# Patient Record
Sex: Female | Born: 1980 | Race: White | Hispanic: No | Marital: Married | Smoking: Never smoker
Health system: Southern US, Community
[De-identification: ages and names within clinical notes are randomized; demographics above are authoritative.]

## PROBLEM LIST (undated history)

## (undated) DIAGNOSIS — O24419 Gestational diabetes mellitus in pregnancy, unspecified control: Secondary | ICD-10-CM

## (undated) DIAGNOSIS — Z789 Other specified health status: Secondary | ICD-10-CM

## (undated) HISTORY — PX: NO PAST SURGERIES: SHX2092

## (undated) HISTORY — DX: Gestational diabetes mellitus in pregnancy, unspecified control: O24.419

---

## 2007-05-27 ENCOUNTER — Emergency Department (HOSPITAL_COMMUNITY): Admission: EM | Admit: 2007-05-27 | Discharge: 2007-05-28 | Payer: Self-pay | Admitting: Emergency Medicine

## 2007-12-07 ENCOUNTER — Other Ambulatory Visit: Admission: RE | Admit: 2007-12-07 | Discharge: 2007-12-07 | Payer: Self-pay | Admitting: Obstetrics and Gynecology

## 2008-12-07 ENCOUNTER — Other Ambulatory Visit: Admission: RE | Admit: 2008-12-07 | Discharge: 2008-12-07 | Payer: Self-pay | Admitting: Obstetrics and Gynecology

## 2011-07-29 LAB — COMPREHENSIVE METABOLIC PANEL
AST: 32
Albumin: 3.5
BUN: 4 — ABNORMAL LOW
Calcium: 8.8
Creatinine, Ser: 0.77
GFR calc Af Amer: >60
Total Protein: 5.4 — ABNORMAL LOW

## 2011-07-29 LAB — URINE CULTURE: Colony Count: 30000

## 2011-07-29 LAB — URINALYSIS, ROUTINE W REFLEX MICROSCOPIC
Bilirubin Urine: NEGATIVE
Hgb urine dipstick: NEGATIVE
Ketones, ur: >80 — AB
Nitrite: NEGATIVE
Specific Gravity, Urine: 1.013
Urobilinogen, UA: 0.2

## 2011-07-29 LAB — POCT PREGNANCY, URINE
Operator id: 208821
Preg Test, Ur: NEGATIVE

## 2011-07-29 LAB — DIFFERENTIAL
Basophils Absolute: 0
Eosinophils Relative: 0
Lymphocytes Relative: 14
Lymphs Abs: 1.7
Monocytes Absolute: 0.8 — ABNORMAL HIGH
Monocytes Relative: 6
Neutro Abs: 10.1 — ABNORMAL HIGH

## 2011-07-29 LAB — CBC
HCT: 38.2
MCV: 88.7
Platelets: 259
RDW: 12.6
WBC: 12.7 — ABNORMAL HIGH

## 2011-07-29 LAB — URINE MICROSCOPIC-ADD ON

## 2014-05-30 DIAGNOSIS — Z319 Encounter for procreative management, unspecified: Secondary | ICD-10-CM | POA: Insufficient documentation

## 2015-10-15 NOTE — L&D Delivery Note (Signed)
Delivery Note  Onset active labor - 0700 SROM - 1330 meconium moderate FHT category 1 in 1st stage of labor Labored in various positions, used labor support and hydrotherapy, Leslie RadonBradley method and doula.   Complete dilation at 1502 after anterior lip reduced over 2 ctx. Onset of pushing at 1500 FHR second stage 1  Pushed squatting, side lying, on the toilet, lythotomy  Analgesia /Anesthesia intrapartum: none  Delivery of a viable baby girl at [id number] by CNM in OA to ROT position. Shoulder dystocia resolved w/ McRoberts and suprapubic. Anterior L arm released and body followed. Pop felt with shoulders delivering. Nuchal Cord none. Gasping effort at birth, baby suctioned after body out and strong cry elicited. Placed on mother's abdomen STS. Cord double clamped after placenta delivered, cut by Leslie Wall, FOB.  Cord blood sample collected.  FHT category 2 in 2nd stage  Placenta delivered spontaneous, complete and intact with 3 VC. Heavy meconium staining, central cord insertion. Placenta to unit for disposal. Uterine tone firm with massage, bleeding moderate and mild atony resolved with massage and IM Pitocin.  2nd perineallaceration and 1 in extension upwards R labia identified.  Anesthesia: lido 1% 10 cc Repair with 3.0 vycril in standard fashion, good hemostasis Est. Blood Loss (mL): 400  Complications: shoulder dystocia x 1 min APGAR: 8/9 Mom to postpartum.  Baby to Couplet care / Skin to Skin.  Leslie Wall, CNM, MSN 07/16/2016, 6:09 PM

## 2015-10-23 DIAGNOSIS — N760 Acute vaginitis: Secondary | ICD-10-CM | POA: Insufficient documentation

## 2015-11-27 DIAGNOSIS — O09811 Supervision of pregnancy resulting from assisted reproductive technology, first trimester: Secondary | ICD-10-CM | POA: Insufficient documentation

## 2015-12-20 LAB — OB RESULTS CONSOLE HIV ANTIBODY (ROUTINE TESTING): HIV: NONREACTIVE

## 2015-12-20 LAB — OB RESULTS CONSOLE RUBELLA ANTIBODY, IGM: RUBELLA: IMMUNE

## 2015-12-20 LAB — OB RESULTS CONSOLE HEPATITIS B SURFACE ANTIGEN: Hepatitis B Surface Ag: NEGATIVE

## 2015-12-26 LAB — OB RESULTS CONSOLE GC/CHLAMYDIA
Chlamydia: NEGATIVE
GC PROBE AMP, GENITAL: NEGATIVE

## 2016-04-30 LAB — OB RESULTS CONSOLE RPR: RPR: NONREACTIVE

## 2016-06-21 LAB — OB RESULTS CONSOLE GBS: GBS: NEGATIVE

## 2016-07-16 ENCOUNTER — Encounter (HOSPITAL_COMMUNITY): Payer: Self-pay

## 2016-07-16 ENCOUNTER — Inpatient Hospital Stay (HOSPITAL_COMMUNITY)
Admission: AD | Admit: 2016-07-16 | Discharge: 2016-07-18 | DRG: 774 | Disposition: A | Discharge status: Home / Self Care | Payer: BLUE CROSS/BLUE SHIELD | Source: Ambulatory Visit

## 2016-07-16 DIAGNOSIS — Z3A39 39 weeks gestation of pregnancy: Secondary | ICD-10-CM

## 2016-07-16 DIAGNOSIS — O4202 Full-term premature rupture of membranes, onset of labor within 24 hours of rupture: Secondary | ICD-10-CM | POA: Diagnosis present

## 2016-07-16 DIAGNOSIS — Z3403 Encounter for supervision of normal first pregnancy, third trimester: Secondary | ICD-10-CM | POA: Diagnosis present

## 2016-07-16 HISTORY — DX: Other specified health status: Z78.9

## 2016-07-16 LAB — TYPE AND SCREEN
ABO/RH(D): A POS
ANTIBODY SCREEN: NEGATIVE

## 2016-07-16 LAB — CBC
HCT: 38.8 % (ref 36.0–46.0)
HEMOGLOBIN: 13.7 g/dL (ref 12.0–15.0)
MCH: 31.3 pg (ref 26.0–34.0)
MCHC: 35.3 g/dL (ref 30.0–36.0)
MCV: 88.6 fL (ref 78.0–100.0)
PLATELETS: 202 10*3/uL (ref 150–400)
RBC: 4.38 MIL/uL (ref 3.87–5.11)
RDW: 13.3 % (ref 11.5–15.5)
WBC: 19.1 10*3/uL — ABNORMAL HIGH (ref 4.0–10.5)

## 2016-07-16 LAB — ABO/RH: ABO/RH(D): A POS

## 2016-07-16 MED ORDER — ONDANSETRON HCL 4 MG/2ML IJ SOLN: 4.0000 mg | INTRAMUSCULAR | Status: DC | PRN

## 2016-07-16 MED ORDER — PRENATAL MULTIVITAMIN CH
1.0000 | ORAL_TABLET | Freq: Every day | ORAL | Status: DC
  Administered 2016-07-17: 1 via ORAL
  Filled 2016-07-16: qty 1

## 2016-07-16 MED ORDER — DIPHENHYDRAMINE HCL 25 MG PO CAPS: 25.0000 mg | ORAL_CAPSULE | Freq: Four times a day (QID) | ORAL | Status: DC | PRN

## 2016-07-16 MED ORDER — SENNOSIDES-DOCUSATE SODIUM 8.6-50 MG PO TABS
2.0000 | ORAL_TABLET | ORAL | Status: DC
  Administered 2016-07-17 – 2016-07-18 (×2): 2 via ORAL
  Filled 2016-07-16 (×2): qty 2

## 2016-07-16 MED ORDER — ONDANSETRON HCL 4 MG/2ML IJ SOLN
4.0000 mg | Freq: Four times a day (QID) | INTRAMUSCULAR | Status: DC | PRN
Start: 2016-07-16 — End: 2016-07-16

## 2016-07-16 MED ORDER — ZOLPIDEM TARTRATE 5 MG PO TABS: 5.0000 mg | ORAL_TABLET | Freq: Every evening | ORAL | Status: DC | PRN

## 2016-07-16 MED ORDER — WITCH HAZEL-GLYCERIN EX PADS: 1.0000 "application " | MEDICATED_PAD | CUTANEOUS | Status: DC | PRN

## 2016-07-16 MED ORDER — ACETAMINOPHEN 325 MG PO TABS: 650.0000 mg | ORAL_TABLET | ORAL | Status: DC | PRN

## 2016-07-16 MED ORDER — ONDANSETRON HCL 4 MG PO TABS: 4.0000 mg | ORAL_TABLET | ORAL | Status: DC | PRN

## 2016-07-16 MED ORDER — OXYTOCIN 40 UNITS IN LACTATED RINGERS INFUSION - SIMPLE MED: 2.5000 [IU]/h | INTRAVENOUS | Status: DC

## 2016-07-16 MED ORDER — OXYTOCIN BOLUS FROM INFUSION: 500.0000 mL | Freq: Once | INTRAVENOUS | Status: DC

## 2016-07-16 MED ORDER — OXYTOCIN 10 UNIT/ML IJ SOLN
10.0000 [IU] | Freq: Once | INTRAMUSCULAR | Status: AC
  Administered 2016-07-16: 10 [IU] via INTRAMUSCULAR
  Filled 2016-07-16: qty 1

## 2016-07-16 MED ORDER — SOD CITRATE-CITRIC ACID 500-334 MG/5ML PO SOLN: 30.0000 mL | ORAL | Status: DC | PRN

## 2016-07-16 MED ORDER — BENZOCAINE-MENTHOL 20-0.5 % EX AERO
1.0000 "application " | INHALATION_SPRAY | CUTANEOUS | Status: DC | PRN
  Administered 2016-07-16: 1 via TOPICAL
  Filled 2016-07-16: qty 56

## 2016-07-16 MED ORDER — IBUPROFEN 600 MG PO TABS
600.0000 mg | ORAL_TABLET | Freq: Four times a day (QID) | ORAL | Status: DC
  Administered 2016-07-16 – 2016-07-18 (×8): 600 mg via ORAL
  Filled 2016-07-16 (×8): qty 1

## 2016-07-16 MED ORDER — LACTATED RINGERS IV SOLN: 500.0000 mL | INTRAVENOUS | Status: DC | PRN

## 2016-07-16 MED ORDER — BISACODYL 10 MG RE SUPP: 10.0000 mg | Freq: Every day | RECTAL | Status: DC | PRN

## 2016-07-16 MED ORDER — DIBUCAINE 1 % RE OINT: 1.0000 "application " | TOPICAL_OINTMENT | RECTAL | Status: DC | PRN

## 2016-07-16 MED ORDER — FLEET ENEMA 7-19 GM/118ML RE ENEM: 1.0000 | ENEMA | Freq: Every day | RECTAL | Status: DC | PRN

## 2016-07-16 MED ORDER — TETANUS-DIPHTH-ACELL PERTUSSIS 5-2.5-18.5 LF-MCG/0.5 IM SUSP: 0.5000 mL | Freq: Once | INTRAMUSCULAR | Status: DC

## 2016-07-16 MED ORDER — SIMETHICONE 80 MG PO CHEW: 80.0000 mg | CHEWABLE_TABLET | ORAL | Status: DC | PRN

## 2016-07-16 MED ORDER — ACETAMINOPHEN 325 MG PO TABS
650.0000 mg | ORAL_TABLET | ORAL | Status: DC | PRN
Start: 2016-07-16 — End: 2016-07-16

## 2016-07-16 MED ORDER — COCONUT OIL OIL
1.0000 | TOPICAL_OIL | Status: DC | PRN
Start: 2016-07-16 — End: 2016-07-18
  Administered 2016-07-17: 1 via TOPICAL
  Filled 2016-07-16: qty 120

## 2016-07-16 MED ORDER — LIDOCAINE HCL (PF) 1 % IJ SOLN
30.0000 mL | INTRAMUSCULAR | Status: DC | PRN
  Administered 2016-07-16: 30 mL via SUBCUTANEOUS
  Filled 2016-07-16: qty 30

## 2016-07-16 NOTE — Progress Notes (Signed)
Leslie Wall is a 35 y.o. G1P0 at 6832w4d by ultrasound admitted for active labor at 6 cm.   Subjective:  Laboring in various positions with support from husband Leslie Wall and doula Leslie Wall. In bath tub for last hour and reported SROM with clear fluid at 1330 per RN. Feeling rectal pressure and urge to push last few ctx.   Objective: Vitals:   07/16/16 1030 07/16/16 1049 07/16/16 1410  BP:  (!) 110/54   Pulse:  72   Resp:  20 20  Temp:  97.8 F (36.6 C) 98.5 F (36.9 C)  TempSrc:  Oral Oral  Weight: 77.1 kg (170 lb)    Height: 5\' 8"  (1.727 m)      No intake/output data recorded. No intake/output data recorded.   FHT:  FHR: 145 bpm, variability: moderate,  accelerations:  Present,  decelerations:  Present mild variable and early decels UC:   regular, every 2-3 minutes, palp moderate SVE:   Dilation: 8 Station: +1 Exam by:: Leslie Wall CNM AF moderate stain mec and + show. Labs:   Recent Labs  07/16/16 1250  WBC 19.1*  HGB 13.7  HCT 38.8  PLT 202    Assessment / Plan: Spontaneous labor, progressing normally  Labor: continue expectant management, encouraged sidelying position for now to facilitate rotation and descent Preeclampsia:  no signs or symptoms of toxicity Fetal Wellbeing:  Category I Pain Control:  Labor support without medications I/D:  n/a Anticipated MOD:  NSVD  Neta Mendsaniela C Wall, CNM, MSN 07/16/2016, 2:14 PM

## 2016-07-16 NOTE — Anesthesia Pain Management Evaluation Note (Signed)
  CRNA Pain Management Visit Note  Patient: Leslie Wall, 35 y.o., female  "Hello I am a member of the anesthesia team at [facility]. We have an anesthesia team available at all times to provide care throughout the hospital, including epidural management and anesthesia for C-section. I don't know your plan for the delivery whether it a natural birth, water birth, IV sedation, nitrous supplementation, doula or epidural, but we want to meet your pain goals."   1.Was your pain managed to your expectations on prior hospitalizations?   No prior hospitalizations  2.What is your expectation for pain management during this hospitalization?     Labor support without medications, Water tub and Doula  3.How can we help you reach that goal? Pt wants all natural birth and does not want to discuss pain methods at this time.  Record the patient's initial score and the patient's pain goal.   Pain:unable to obtain  Pain Goal: unable to obtain [facility] wants you to be able to say your pain was always managed very well.  Kimya Mccahill 07/16/2016

## 2016-07-16 NOTE — H&P (Signed)
OB ADMISSION/ HISTORY & PHYSICAL:  Admission Date: 07/16/2016  9:57 AM  Admit Diagnosis: Term pregnancy in active labor  Leslie Wall is a 35 y.o. female presenting for ctx that started yesterday, more regular and stronger since 0700 today. No LOF/VB, (+) FM. Mild nausea, no HA or visual changes. Desires unmedicated birth, using Erven CollaBradley method, spouse Ines BloomerShawn and doula Trula OreChristina for support.   Prenatal History: G1P0   EDC : Not found.  [facility]atal [facility] at [facility] since [redacted] weeks gestation, transfer from [facility] for CNM management.  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  Prenatal Labs: ABO, Rh:  A pos  Antibody: Neg Rubella: Immune (03/08 0000)  RPR: Nonreactive (07/18 0000)  HBsAg: Negative (03/08 0000)  HIV: Non-reactive (03/08 0000)  GBS: Negative (09/08 0000)  1 hr Glucola : 93 Anatomy us female, no anomalies, posterior placenta   Medical / Surgical History :  Past medical history:  Past Medical History:  Diagnosis Date  . Medical history non-contributory      Past surgical history:  Past Surgical History:  Procedure Laterality Date  . NO PAST SURGERIES       Family History: No family history on file.   Social History:  reports that she has never smoked. She has never used smokeless tobacco. She reports that she does not drink alcohol or use drugs.   Allergies: Latex    Current Medications at time of admission:  Prescriptions Prior to Admission  Medication Sig Dispense Refill Last Dose  . calcium carbonate (TUMS - DOSED IN MG ELEMENTAL CALCIUM) 500 MG chewable tablet Chew 2 tablets by mouth as needed for indigestion or heartburn.   07/15/2016 at Unknown time  . diphenhydrAMINE (BENADRYL) 25 MG tablet Take 50 mg by mouth at bedtime as needed for sleep.   07/15/2016 at Unknown time  . Prenatal Vit-Fe Fumarate-FA (PRENATAL MULTIVITAMIN) TABS tablet Take 1 tablet by mouth at bedtime.    07/15/2016 at Unknown time      Review of Systems: ROS  As  noted above     Physical Exam:     General:AAO x 3, breathing w/ ctx and coping well Heart: RRR Lungs:CTAB Abdomen: gravid, NT Extremities:no edema Genitalia / VE: 6 cm per CNM in office at 0930, IBOW FHR: 140, mod var, + accels no decels TOCO: q 2-4, mod to palp  Labs:    Pending CBC, T&S, RPR   Assessment:  35 y.o. G1P0 at 4867w4d, desires low-intervention birth  1. Labor: active 2. Fetal Wellbeing: Category 1  3. Pain Control: labor support / Elige RadonBradley method 4. GBS: neg  Plan:  1. Admit to BS 2. Routine L&D orders 3. Analgesia/anesthesia PRN  4. Expectant management    Consultant: Dr. Naomie DeanLavoie    Daniela C Paul, CNM, MSN 07/16/2016, 2:25 PM

## 2016-07-17 ENCOUNTER — Encounter (HOSPITAL_COMMUNITY): Payer: Self-pay | Admitting: *Deleted

## 2016-07-17 LAB — CBC
HEMATOCRIT: 29.2 % — AB (ref 36.0–46.0)
HEMOGLOBIN: 10.4 g/dL — AB (ref 12.0–15.0)
MCH: 30.5 pg (ref 26.0–34.0)
MCHC: 35.3 g/dL (ref 30.0–36.0)
MCV: 86.4 fL (ref 78.0–100.0)
Platelets: 164 10*3/uL (ref 150–400)
RBC: 3.38 MIL/uL — AB (ref 3.87–5.11)
RDW: 13.1 % (ref 11.5–15.5)
WBC: 21.5 10*3/uL — ABNORMAL HIGH (ref 4.0–10.5)

## 2016-07-17 LAB — RPR: RPR: NONREACTIVE

## 2016-07-17 MED ORDER — IBUPROFEN 600 MG PO TABS: 600.0000 mg | ORAL_TABLET | Freq: Four times a day (QID) | ORAL | 0 refills | Status: DC

## 2016-07-17 NOTE — Progress Notes (Signed)
PPD 1 SVD  S:  Reports feeling well             Tolerating po/ No nausea or vomiting             Bleeding is light             Pain controlled withibuprofen (OTC)             Up ad lib / ambulatory / voiding QS  Newborn breast feeding  O:               VS: BP 106/62   Pulse 78   Temp 98.4 F (36.9 C)   Resp 18   Ht 5\' 8"  (1.727 m)   Wt 77.1 kg (170 lb)   SpO2 98%   Breastfeeding? Unknown   BMI 25.85 kg/m    LABS:              Recent Labs  07/16/16 1250 07/17/16 0548  WBC 19.1* 21.5*  HGB 13.7 10.4*  PLT 202 164               Blood type: --/--/A POS, A POS (10/03 1250)  Rubella: Immune (03/08 0000)                     I&O: Intake/Output      10/03 0701 - 10/04 0700 10/04 0701 - 10/05 0700   Blood 400    Total Output 400     Net -400                        Physical Exam:             Alert and oriented X3  Abdomen: soft, non-tender, non-distended              Fundus: firm, non-tender, Ueven  Perineum: mild edema  Lochia: light  Extremities: no edema, no calf pain or tenderness    A: PPD # 1   Doing well - stable status  P: Routine post partum orders  DC home  Marlinda MikeBAILEY, TANYA CNM, MSN, [facility] 07/17/2016, 4:14 PM

## 2016-07-17 NOTE — Lactation Note (Signed)
This note was copied from a baby's chart. Lactation Consultation Note  Patient Name: Girl Roxanne GatesKatherine Keckler ZOXWR'UToday's Date: 07/17/2016 Reason for consult: Initial assessment  Initial visit at 18 hours of life. "Autumn" has been to the breast multiple times & has had stooled (x3) and voided (x3). Mom reports + breast changes w/pregnancy.   Mom with c/o nipple soreness; she has "hickeys" on areola and some bruising on nipples. Specifics of an asymmetric latch shown via [employer] website animation. Specifics of hand placement also discussed. Mom was taught signs/sound of swallowing  Pictorial diaper sheet provided. Mom made aware of O/P services, breastfeeding support groups, community resources, and our phone # for post-discharge questions.   Lurline HareRichey, Jilliann Subramanian [facility] 07/17/2016, 11:34 AM

## 2016-07-17 NOTE — Discharge Summary (Signed)
Obstetric Discharge Summary Reason for Admission: onset of labor Prenatal Procedures: none Intrapartum Procedures: spontaneous vaginal delivery Postpartum Procedures: none Complications-Operative and Postpartum: 2nd degree perineal laceration Hemoglobin  Date Value Ref Range Status  07/17/2016 10.4 (L) 12.0 - 15.0 g/dL Final    Comment:    DELTA CHECK NOTED REPEATED TO VERIFY    HCT  Date Value Ref Range Status  07/17/2016 29.2 (L) 36.0 - 46.0 % Final    Physical Exam:  General: alert, cooperative and no distress Lochia: appropriate Uterine Fundus: firm Incision: healing well DVT Evaluation: No evidence of DVT seen on physical exam.  Discharge Diagnoses: Term Pregnancy-delivered  Discharge Information: Date: 07/17/2016 Activity: pelvic rest Diet: routine Medications: PNV and Ibuprofen Condition: stable Instructions: refer to practice specific booklet Discharge to: home Follow-up Information    Leslie Wall, Leslie Wall. Schedule an appointment as soon as possible for a visit in 2 week(s).   Specialty:  Obstetrics and Gynecology Contact information: [place] [place] [address] [phone]           Newborn Data: Live born female  Birth Weight: 9 lb 10.7 oz (4385 g) APGAR: 8, 9  Home with mother.  Leslie Wall, Leslie Wall 07/17/2016, 4:16 PM

## 2016-07-17 NOTE — Lactation Note (Signed)
This note was copied from a baby's chart. Lactation Consultation Note  Patient Name: Leslie Wall ZOXWR'UToday's Date: 07/17/2016 Reason for consult: Follow-up assessment Baby at 28 hr of life. RN requesting latch check. Upon entry baby was on the L breast. Baby seemed unsettled, was on/off the breast. When baby came off the final time, the nipple was compressed, looked like a tube of lipstick. The L nipple is shorter and wider than the R nipple. After FOB changed baby's diaper mom placed baby on the R breast. Baby was able to maintain a latch with longer bursts of sucking. The nipple appeared normal when the baby came off. Despite baby latching better to the R no swallows were noted. Baby had increased respirations the entire time at the breast. RN was given report. Discussed baby behavior, feeding frequency, pumping, manual expression, baby belly size, voids, wt loss, breast changes, and nipple care. Mom is aware of lactation services and support group. She will call as needed.    Maternal Data    Feeding Feeding Type: Breast Fed Length of feed: 10 min  LATCH Score/Interventions Latch: Repeated attempts needed to sustain latch, nipple held in mouth throughout feeding, stimulation needed to elicit sucking reflex. Intervention(s): Adjust position;Assist with latch;Breast massage;Breast compression  Audible Swallowing: None Intervention(s): Skin to skin;Hand expression Intervention(s): Alternate breast massage  Type of Nipple: Everted at rest and after stimulation  Comfort (Breast/Nipple): Filling, red/small blisters or bruises, mild/mod discomfort  Problem noted: Mild/Moderate discomfort;Cracked, bleeding, blisters, bruises Interventions  (Cracked/bleeding/bruising/blister): Expressed breast milk to nipple Interventions (Mild/moderate discomfort):  (shells and coconut oil )  Hold (Positioning): Assistance needed to correctly position infant at breast and maintain  latch. Intervention(s): Support Pillows;Position options  LATCH Score: 5  Lactation Tools Discussed/Used     Consult Status Consult Status: Follow-up Date: 07/18/16 Follow-up type: In-patient    Leslie Wall 07/17/2016, 10:21 PM

## 2016-07-18 NOTE — Lactation Note (Signed)
This note was copied from a baby's chart. Lactation Consultation Note  Patient Name: Leslie Wall ZOXWR'UToday's Date: 07/18/2016 Reason for consult: Follow-up assessment Mom reports baby is nursing well. Denies questions/concerns. Engorgement care reviewed if needed. Advised of OP services and support group. Offered to observe latch before d/c, Mom declined.   Maternal Data    Feeding Feeding Type: Breast Fed Length of feed: 45 min  LATCH Score/Interventions Latch: Grasps breast easily, tongue down, lips flanged, rhythmical sucking.  Audible Swallowing: A few with stimulation  Type of Nipple: Everted at rest and after stimulation  Comfort (Breast/Nipple): Filling, red/small blisters or bruises, mild/mod discomfort  Problem noted: Cracked, bleeding, blisters, bruises;Mild/Moderate discomfort Interventions  (Cracked/bleeding/bruising/blister): Expressed breast milk to nipple (coconut oil)  Hold (Positioning): No assistance needed to correctly position infant at breast.  LATCH Score: 8  Lactation Tools Discussed/Used     Consult Status Consult Status: Complete Date: 07/18/16 Follow-up type: In-patient    Alfred LevinsGranger, Ryn Peine Ann 07/18/2016, 11:16 AM

## 2016-07-18 NOTE — Progress Notes (Signed)
S: Was discharged to home yesterday, remained inpatient 2/2 newborn not discharged by peds. Feels well today, breastfeeding well on demand, voiding freely, minimal pain. Very happy with birth experience.   O: VSSAF  FF belwou U Perineum: minimal edema, small lochia, repair intact Ext: no edema  A/P: PPD #2, stable DC home with newborn F/U with WOB in 2 wks.  Routine PP care.

## 2016-07-19 ENCOUNTER — Other Ambulatory Visit: Payer: Self-pay | Admitting: Pediatrics

## 2017-06-04 ENCOUNTER — Emergency Department (HOSPITAL_COMMUNITY)
Admission: EM | Admit: 2017-06-04 | Discharge: 2017-06-04 | Disposition: A | Discharge status: Home / Self Care | Payer: BLUE CROSS/BLUE SHIELD | Attending: Emergency Medicine | Admitting: Emergency Medicine

## 2017-06-04 ENCOUNTER — Emergency Department (HOSPITAL_COMMUNITY): Payer: BLUE CROSS/BLUE SHIELD

## 2017-06-04 ENCOUNTER — Encounter (HOSPITAL_COMMUNITY): Payer: Self-pay | Admitting: Emergency Medicine

## 2017-06-04 DIAGNOSIS — J0120 Acute ethmoidal sinusitis, unspecified: Secondary | ICD-10-CM | POA: Diagnosis not present

## 2017-06-04 DIAGNOSIS — R51 Headache: Secondary | ICD-10-CM

## 2017-06-04 DIAGNOSIS — R519 Headache, unspecified: Secondary | ICD-10-CM

## 2017-06-04 DIAGNOSIS — R509 Fever, unspecified: Secondary | ICD-10-CM | POA: Diagnosis not present

## 2017-06-04 DIAGNOSIS — Z91040 Latex allergy status: Secondary | ICD-10-CM | POA: Insufficient documentation

## 2017-06-04 LAB — CBC WITH DIFFERENTIAL/PLATELET
BASOS PCT: 0 %
Basophils Absolute: 0 10*3/uL (ref 0.0–0.1)
EOS ABS: 0 10*3/uL (ref 0.0–0.7)
Eosinophils Relative: 0 %
HCT: 38.6 % (ref 36.0–46.0)
Hemoglobin: 12.8 g/dL (ref 12.0–15.0)
LYMPHS ABS: 2.3 10*3/uL (ref 0.7–4.0)
Lymphocytes Relative: 31 %
MCH: 29 pg (ref 26.0–34.0)
MCHC: 33.2 g/dL (ref 30.0–36.0)
MCV: 87.5 fL (ref 78.0–100.0)
Monocytes Absolute: 0.5 10*3/uL (ref 0.1–1.0)
Monocytes Relative: 7 %
NEUTROS PCT: 62 %
Neutro Abs: 4.7 10*3/uL (ref 1.7–7.7)
PLATELETS: 241 10*3/uL (ref 150–400)
RBC: 4.41 MIL/uL (ref 3.87–5.11)
RDW: 13.2 % (ref 11.5–15.5)
WBC: 7.5 10*3/uL (ref 4.0–10.5)

## 2017-06-04 LAB — I-STAT BETA HCG BLOOD, ED (MC, WL, AP ONLY)

## 2017-06-04 LAB — BASIC METABOLIC PANEL
Anion gap: 9 (ref 5–15)
BUN: 7 mg/dL (ref 6–20)
CALCIUM: 9 mg/dL (ref 8.9–10.3)
CO2: 22 mmol/L (ref 22–32)
CREATININE: 0.67 mg/dL (ref 0.44–1.00)
Chloride: 105 mmol/L (ref 101–111)
GFR calc non Af Amer: >60 mL/min (ref >60)
Glucose, Bld: 122 mg/dL — ABNORMAL HIGH (ref 65–99)
Potassium: 4.2 mmol/L (ref 3.5–5.1)
SODIUM: 136 mmol/L (ref 135–145)

## 2017-06-04 MED ORDER — AMOXICILLIN-POT CLAVULANATE 500-125 MG PO TABS: 1.0000 | ORAL_TABLET | Freq: Three times a day (TID) | ORAL | 0 refills | Status: DC

## 2017-06-04 MED ORDER — SODIUM CHLORIDE 0.9 % IV BOLUS (SEPSIS)
1000.0000 mL | Freq: Once | INTRAVENOUS | Status: AC
  Administered 2017-06-04: 1000 mL via INTRAVENOUS

## 2017-06-04 MED ORDER — ONDANSETRON 8 MG PO TBDP: ORAL_TABLET | ORAL | 0 refills | Status: DC

## 2017-06-04 MED ORDER — KETOROLAC TROMETHAMINE 30 MG/ML IJ SOLN
30.0000 mg | Freq: Once | INTRAMUSCULAR | Status: AC
  Administered 2017-06-04: 30 mg via INTRAVENOUS
  Filled 2017-06-04: qty 1

## 2017-06-04 NOTE — ED Provider Notes (Signed)
[facility]-EMERGENCY DEPT Provider Note   CSN: [id number] Arrival date & time: 06/04/17  2952     History   Chief Complaint Chief Complaint  Patient presents with  . Headache  . Fever    HPI Leslie Wall is a 36 y.o. female.  Patient is a 36 year old female with no significant past medical history presenting with complaints of headache and fever. This is worsened over the past 2 days. The pain is mainly in the front of her head and radiates to her neck. She denies any visual disturbances, numbness, or tingling. She reports fever at home up to 102.5 which has improved with ibuprofen.   The history is provided by the patient.  Headache   This is a new problem. The current episode started 2 days ago. The problem occurs constantly. The problem has been gradually worsening. The headache is associated with nothing. The pain is located in the frontal region. The pain is moderate. Associated symptoms include a fever. She has tried NSAIDs for the symptoms. The treatment provided mild relief.  Fever   Associated symptoms include headaches.    Past Medical History:  Diagnosis Date  . Medical history non-contributory     Patient Active Problem List   Diagnosis Date Noted  . Postpartum care following vaginal delivery (10/3) 07/17/2016  . Normal labor 07/16/2016    Past Surgical History:  Procedure Laterality Date  . NO PAST SURGERIES      OB History    Gravida Para Term Preterm AB Living   1 1 1     1    SAB TAB Ectopic Multiple Live Births         0 1       Home Medications    Prior to Admission medications   Medication Sig Start Date End Date Taking? Authorizing Provider  calcium carbonate (TUMS - DOSED IN MG ELEMENTAL CALCIUM) 500 MG chewable tablet Chew 2 tablets by mouth as needed for indigestion or heartburn.    [provider]  diphenhydrAMINE (BENADRYL) 25 MG tablet Take 50 mg by mouth at bedtime as needed for sleep.    [provider]  ibuprofen  (ADVIL,MOTRIN) 600 MG tablet Take 1 tablet (600 mg total) by mouth every 6 (six) hours. 07/17/16   Marlinda Mike, CNM  Prenatal Vit-Fe Fumarate-FA (PRENATAL MULTIVITAMIN) TABS tablet Take 1 tablet by mouth at bedtime.     [provider]    Family History No family history on file.  Social History Social History  Substance Use Topics  . Smoking status: Never Smoker  . Smokeless tobacco: Never Used  . Alcohol use No     Allergies   Latex   Review of Systems Review of Systems  Constitutional: Positive for fever.  Neurological: Positive for headaches.  All other systems reviewed and are negative.    Physical Exam Updated Vital Signs BP 101/68 (BP Location: Right Arm)   Pulse 90   Temp 98.4 F (36.9 C) (Oral)   Resp 18   Ht 5\' 8"  (1.727 m)   Wt 61.2 kg (135 lb)   LMP 05/22/2017   SpO2 98%   BMI 20.53 kg/m   Physical Exam  Constitutional: She is oriented to person, place, and time. She appears well-developed and well-nourished. No distress.  HENT:  Head: Normocephalic and atraumatic.  Mouth/Throat: Oropharynx is clear and moist. No oropharyngeal exudate.  Eyes: Pupils are equal, round, and reactive to light. EOM are normal.  Neck: Normal range of motion.  Neck supple.  Cardiovascular: Normal rate and regular rhythm.  Exam reveals no gallop and no friction rub.   No murmur heard. Pulmonary/Chest: Effort normal and breath sounds normal. No respiratory distress. She has no wheezes. She has no rales.  Abdominal: Soft. Bowel sounds are normal. She exhibits no distension. There is no tenderness.  Musculoskeletal: Normal range of motion. She exhibits no edema.  Lymphadenopathy:    She has no cervical adenopathy.  Neurological: She is alert and oriented to person, place, and time. No cranial nerve deficit. She exhibits normal muscle tone. Coordination normal.  Skin: Skin is warm and dry. She is not diaphoretic.  Nursing note and vitals reviewed.    ED  Treatments / Results  Labs (all labs ordered are listed, but only abnormal results are displayed) Labs Reviewed  BASIC METABOLIC PANEL  CBC WITH DIFFERENTIAL/PLATELET  URINALYSIS, ROUTINE W REFLEX MICROSCOPIC  I-STAT BETA HCG BLOOD, ED ([facility], [facility], [facility] ONLY)    EKG  EKG Interpretation None       Radiology No results found.  Procedures Procedures (including critical care time)  Medications Ordered in ED Medications  sodium chloride 0.9 % bolus 1,000 mL (not administered)  ketorolac (TORADOL) 30 MG/ML injection 30 mg (not administered)     Initial Impression / Assessment and Plan / ED Course  I have reviewed the triage vital signs and the nursing notes.  Pertinent labs & imaging results that were available during my care of the patient were reviewed by me and considered in my medical decision making (see chart for details).  Patient for evaluation of headache and fever that started yesterday evening. She denies any visual disturbances but does have some subjective stiff neck. Her physical examination is unremarkable and laboratory studies are reassuring. To my exam, she has full range of motion in her neck without limitation. I highly doubt meningitis. ACT scan does reveal mucosal thickening of the ethmoid sinus. She will be treated as sinusitis with Augmentin. She is to return as needed and follow-up if not improving.  Final Clinical Impressions(s) / ED Diagnoses   Final diagnoses:  None    New Prescriptions New Prescriptions   No medications on file     Geoffery Lyons, MD 06/04/17 1645

## 2017-06-04 NOTE — Discharge Instructions (Signed)
Augmentin as prescribed.  Ibuprofen 600 mg rotated with Tylenol 1000 mg every 4 hours as needed for pain or fever.  Return to the emergency department for worsening headache, difficulty breathing, or other new and concerning symptoms.

## 2017-06-04 NOTE — ED Triage Notes (Signed)
Patient presents from ome with complaints of headache and neck pain x7 days. Patient reports she has been taking ibuprofen for the pain which has help mildly. Patient reports yesterday she devolved a fever. Reports medication as help to decrease. Patient alert and oriented on arrival. Patient ambulated to the room without assistance.

## 2017-10-14 NOTE — L&D Delivery Note (Signed)
Delivery Note  First Stage: Labor onset: 0500 Analgesia /Anesthesia intrapartum: none AROM at 1141 - moderate meconium - green color  Second Stage: Complete dilation at 1213 Onset of pushing at 1213 FHR second stage category 2  Delivery of a viable female at 1221 by CNM in ROA position Thick particulate meconium with crowning no nuchal cord Spontaneous cry - oropharynx bulb suctioned - thin yellow fluid Cord double clamped after cessation of pulsation, cut by FOB Cord blood sample collected   Third Stage: Large amount terminal meconium - thick particulate from vagina prior to placenta separation / delivery Placenta delivered 1227 intact with 3 VC Placenta disposition: hospital disposal Uterine tone firm / bleeding initial gush with placenta separation - resolved with delivery of placenta  Small right sulcus and partial 2nd degree (splayed) laceration identified  Anesthesia for repair: 1% xylocaine local Repair 3-0 vicryl for vaginal sulcus repair / 3-0 vicryl for perineal muscle repair - reanastomosis of anatomy / 4-0 vicryl subcuticular Est. Blood Loss (mL): 300  Complications: worsening MSF at birth - no immediate complications   Mom to postpartum.  Baby to Couplet care / Skin to Skin.  Newborn: Birth Weight: 10 pounds - 8 oz  Apgar Scores: 1-minute: 9                           5-minute:   Feeding planned: breast  Marlinda Mike CNM, MSN, [facility] 03/11/2018, 12:48 PM

## 2018-03-11 ENCOUNTER — Other Ambulatory Visit: Payer: Self-pay

## 2018-03-11 ENCOUNTER — Inpatient Hospital Stay (HOSPITAL_COMMUNITY)
Admission: AD | Admit: 2018-03-11 | Discharge: 2018-03-12 | DRG: 807 | Disposition: A | Discharge status: Home / Self Care | Payer: BLUE CROSS/BLUE SHIELD | Source: Ambulatory Visit | Attending: Obstetrics and Gynecology | Admitting: Obstetrics and Gynecology

## 2018-03-11 ENCOUNTER — Encounter (HOSPITAL_COMMUNITY): Payer: Self-pay | Admitting: *Deleted

## 2018-03-11 DIAGNOSIS — Z3A40 40 weeks gestation of pregnancy: Secondary | ICD-10-CM

## 2018-03-11 DIAGNOSIS — O99824 Streptococcus B carrier state complicating childbirth: Principal | ICD-10-CM | POA: Diagnosis present

## 2018-03-11 DIAGNOSIS — Z3483 Encounter for supervision of other normal pregnancy, third trimester: Secondary | ICD-10-CM | POA: Diagnosis present

## 2018-03-11 LAB — CBC
HCT: 38.6 % (ref 36.0–46.0)
Hemoglobin: 12.7 g/dL (ref 12.0–15.0)
MCH: 30.2 pg (ref 26.0–34.0)
MCHC: 32.9 g/dL (ref 30.0–36.0)
MCV: 91.9 fL (ref 78.0–100.0)
Platelets: 182 10*3/uL (ref 150–400)
RBC: 4.2 MIL/uL (ref 3.87–5.11)
RDW: 14 % (ref 11.5–15.5)
WBC: 11.4 10*3/uL — ABNORMAL HIGH (ref 4.0–10.5)

## 2018-03-11 LAB — TYPE AND SCREEN
ABO/RH(D): A POS
Antibody Screen: NEGATIVE

## 2018-03-11 MED ORDER — DIBUCAINE 1 % RE OINT
1.0000 | TOPICAL_OINTMENT | RECTAL | Status: DC | PRN
Start: 2018-03-11 — End: 2018-03-12

## 2018-03-11 MED ORDER — LACTATED RINGERS IV SOLN: 500.0000 mL | INTRAVENOUS | Status: DC | PRN

## 2018-03-11 MED ORDER — OXYTOCIN BOLUS FROM INFUSION
500.0000 mL | Freq: Once | INTRAVENOUS | Status: AC
  Administered 2018-03-11: 500 mL via INTRAVENOUS

## 2018-03-11 MED ORDER — BENZOCAINE-MENTHOL 20-0.5 % EX AERO
1.0000 "application " | INHALATION_SPRAY | CUTANEOUS | Status: DC | PRN
  Filled 2018-03-11: qty 56

## 2018-03-11 MED ORDER — OXYCODONE-ACETAMINOPHEN 5-325 MG PO TABS: 1.0000 | ORAL_TABLET | ORAL | Status: DC | PRN

## 2018-03-11 MED ORDER — SIMETHICONE 80 MG PO CHEW: 80.0000 mg | CHEWABLE_TABLET | ORAL | Status: DC | PRN

## 2018-03-11 MED ORDER — IBUPROFEN 600 MG PO TABS
600.0000 mg | ORAL_TABLET | Freq: Four times a day (QID) | ORAL | Status: DC
  Administered 2018-03-11 – 2018-03-12 (×4): 600 mg via ORAL
  Filled 2018-03-11 (×4): qty 1

## 2018-03-11 MED ORDER — SOD CITRATE-CITRIC ACID 500-334 MG/5ML PO SOLN: 30.0000 mL | ORAL | Status: DC | PRN

## 2018-03-11 MED ORDER — SODIUM CHLORIDE 0.9 % IV SOLN
2.0000 g | Freq: Once | INTRAVENOUS | Status: AC
  Administered 2018-03-11: 2 g via INTRAVENOUS
  Filled 2018-03-11: qty 2

## 2018-03-11 MED ORDER — ACETAMINOPHEN 325 MG PO TABS: 650.0000 mg | ORAL_TABLET | ORAL | Status: DC | PRN

## 2018-03-11 MED ORDER — SODIUM CHLORIDE 0.9% FLUSH: 3.0000 mL | Freq: Two times a day (BID) | INTRAVENOUS | Status: DC

## 2018-03-11 MED ORDER — SODIUM CHLORIDE 0.9% FLUSH: 3.0000 mL | INTRAVENOUS | Status: DC | PRN

## 2018-03-11 MED ORDER — SODIUM CHLORIDE 0.9 % IV SOLN: 250.0000 mL | INTRAVENOUS | Status: DC | PRN

## 2018-03-11 MED ORDER — LIDOCAINE HCL (PF) 1 % IJ SOLN
30.0000 mL | INTRAMUSCULAR | Status: DC | PRN
  Administered 2018-03-11: 30 mL via SUBCUTANEOUS
  Filled 2018-03-11: qty 30

## 2018-03-11 MED ORDER — COCONUT OIL OIL: 1.0000 "application " | TOPICAL_OIL | Status: DC | PRN

## 2018-03-11 MED ORDER — WITCH HAZEL-GLYCERIN EX PADS: 1.0000 "application " | MEDICATED_PAD | CUTANEOUS | Status: DC | PRN

## 2018-03-11 MED ORDER — OXYTOCIN 40 UNITS IN LACTATED RINGERS INFUSION - SIMPLE MED
2.5000 [IU]/h | INTRAVENOUS | Status: DC
  Filled 2018-03-11: qty 1000

## 2018-03-11 MED ORDER — SENNOSIDES-DOCUSATE SODIUM 8.6-50 MG PO TABS
2.0000 | ORAL_TABLET | ORAL | Status: DC
  Administered 2018-03-11: 2 via ORAL
  Filled 2018-03-11: qty 2

## 2018-03-11 NOTE — H&P (Signed)
  OB ADMISSION/ HISTORY & PHYSICAL:  Admission Date: 03/11/2018  8:40 AM  Admit Diagnosis: active labor  Leslie Wall is a 37 y.o. female presenting for admission of labor.  Prenatal History: G1P1001   EDC : 03/07/2018 Prenatal care at [facility]  Primary Ob Provider: Fredric Mare CNM Prenatal course complicated by AMA, hx LGA previous pregnancy (successful SVB of 10lb 9oz without complications), size> dates this pregnancy (declined growth sono), post-dates   Prenatal Labs: ABO, Rh:  A pos Antibody:  negative Rubella:   Immune RPR:   NR HBsAg:   negative HIV:   NR GTT: passed GBS:   positive  Medical / Surgical History :  Past medical history:  Past Medical History:  Diagnosis Date  . Medical history non-contributory      Past surgical history:  Past Surgical History:  Procedure Laterality Date  . NO PAST SURGERIES      Family History: No family history on file.   Social History:  reports that she has never smoked. She has never used smokeless tobacco. She reports that she does not drink alcohol or use drugs.   Allergies: Latex    Current Medications at time of admission:  Prior to Admission medications   Medication Sig Start Date End Date Taking? Authorizing Provider  Multiple Vitamin (MULTIVITAMIN WITH MINERALS) TABS tablet Take 1 tablet by mouth daily.    [provider]   Review of Systems: Active FM onset of ctx @ 0600 currently every 2-3 minutes No LOF bloody show present  Physical Exam:  VS: 98.1-102-20-117/66 General: alert and oriented, appears uncomfortable with ctx Heart: RRR Lungs: Clear lung fields Abdomen: Gravid, soft and non-tender, non-distended / uterus: gravid Extremities: trace pedal edema  Genitalia / VE:  (in office - Dr Amado Nash) 6cm / 90% / vtx BBOW  FHR: baseline rate 140 / variability moderate / accelerations + / no decelerations TOCO: ctx every 1-3 minutes  Assessment: 40.[redacted] weeks gestation +  GBS active stage of labor FHR category 1  Plan:  admit with anticipated SVB expectant management Ampicillin for GBS prophylaxis  Dr Billy Coast notified of admission    Marlinda Mike CNM, MSN, [facility] 03/11/2018, 9:08 AM

## 2018-03-11 NOTE — Lactation Note (Signed)
This note was copied from a baby's chart. Lactation Consultation Note  Patient Name: Leslie Wall [id number] Date: 03/11/2018 Reason for consult: Initial assessment;Term   P2 mother whose infant is now 63 hours old.  Mother has a 57 month old who she breastfed for 16 months.  RN in room assessing baby as I arrived.  Mother states that baby is latching very well and she has no questions/concerns at this time.  She seems very confident and pleased with her experience so far.  Reviewed feeding 8-12 times/24 hours or more if infant shows feeding cues.  Encouraged breast massage and hand expression to increase milk supply.  Mom made aware of O/P services, breastfeeding support groups, community resources, and our phone # for post-discharge questions. FOB present. Mother will call for assistance if needed.    Maternal Data Formula Feeding for Exclusion: No Has patient been taught Hand Expression?: Yes Does the patient have breastfeeding experience prior to this delivery?: Yes  Feeding Feeding Type: Breast Fed Length of feed: 15 min  LATCH Score                   Interventions    Lactation Tools Discussed/Used WIC Program: No   Consult Status Consult Status: Follow-up Date: 03/12/18 Follow-up type: In-patient    Leslie Wall 03/11/2018, 11:45 PM

## 2018-03-11 NOTE — Progress Notes (Signed)
S:  painful ctx - moaning & crying with most ctx        doula and spouse at bedside providing labor support  O:  VS: BP (!) 95/56   Pulse 85   Temp 98.1 F (36.7 C) (Oral)   Resp 20   Ht  (1.702 m)   Wt 74 kg (163 lb 3.2 oz)   LMP 05/22/2017   BMI 25.56 kg/m         FHR : intermittent         Toco: contractions every 2 minutes / moderate to strong        Cervix : 8cm / 90% / vtx 0 station ROT / BBOW        Membranes: AROM - moderate green meconium                             EFM applied with wireless system to assess FHR pattern  A: active labor     MSF  P: EFM to assess FHR pattern - continuous with any decels      expectant management        Marlinda Mike CNM, MSN, [facility] 03/11/2018, 11:48 AM

## 2018-03-11 NOTE — Anesthesia Pain Management Evaluation Note (Signed)
  CRNA Pain Management Visit Note  Patient: Leslie Wall, 37 y.o., female  "Hello I am a member of the anesthesia team at [facility]. We have an anesthesia team available at all times to provide care throughout the hospital, including epidural management and anesthesia for C-section. I don't know your plan for the delivery whether it a natural birth, water birth, IV sedation, nitrous supplementation, doula or epidural, but we want to meet your pain goals."   1.Was your pain managed to your expectations on prior hospitalizations?   Yes   2.What is your expectation for pain management during this hospitalization?     Doula  3.How can we help you reach that goal? Desires natural delivery with a doula  Record the patient's initial score and the patient's pain goal.   Pain: 10  Pain Goal: 10 [facility] wants you to be able to say your pain was always managed very well.  Laban Emperor 03/11/2018

## 2018-03-12 LAB — CBC
HCT: 33.1 % — ABNORMAL LOW (ref 36.0–46.0)
Hemoglobin: 10.9 g/dL — ABNORMAL LOW (ref 12.0–15.0)
MCH: 30.5 pg (ref 26.0–34.0)
MCHC: 32.9 g/dL (ref 30.0–36.0)
MCV: 92.7 fL (ref 78.0–100.0)
Platelets: 169 10*3/uL (ref 150–400)
RBC: 3.57 MIL/uL — ABNORMAL LOW (ref 3.87–5.11)
RDW: 14 % (ref 11.5–15.5)
WBC: 14.1 10*3/uL — ABNORMAL HIGH (ref 4.0–10.5)

## 2018-03-12 LAB — RPR: RPR Ser Ql: NONREACTIVE

## 2018-03-12 MED ORDER — IBUPROFEN 600 MG PO TABS: 600.0000 mg | ORAL_TABLET | Freq: Four times a day (QID) | ORAL | 0 refills | Status: DC

## 2018-03-12 NOTE — Progress Notes (Signed)
PPD 1 SVD with 2nd degree laceration repaired  S:  Reports feeling well - no concerns / wants to go home but baby remains per Peds             Tolerating po/ No nausea or vomiting             Bleeding is light             Pain controlled with motrin             Up ad lib / ambulatory / voiding QS  Newborn Breast   O:   VS: BP (!) 104/54 (BP Location: Left Arm) Comment: patient asymptomatic  Pulse 68   Temp 98.4 F (36.9 C) (Oral)   Resp 18   Ht  (1.702 m)   Wt 74 kg (163 lb 3.2 oz)   LMP 05/22/2017   SpO2 100%   Breastfeeding? Unknown   BMI 25.56 kg/m    LABS:              Recent Labs    03/11/18 0905 03/12/18 0518  WBC 11.4* 14.1*  HGB 12.7 10.9*  PLT 182 169               Blood type: --/--/A POS (05/29 0930)  Rubella:      Immune                 I&O: Intake/Output      05/29 0701 - 05/30 0700 05/30 0701 - 05/31 0700   Urine (mL/kg/hr) 0    Blood 300    Total Output 300    Net -300         Urine Occurrence 1 x                 Physical Exam:             Alert and oriented X3  Abdomen: soft, non-tender, non-distended              Fundus: firm, non-tender, Ueven  Perineum: mild edema - ice pack in place  Lochia: light  Extremities: no edema, no calf pain or tenderness  A: PPD # 1   Doing well - stable status  P: Routine post partum orders  Dc home - to room-in with mother  Marlinda Mike CNM, MSN, [facility] 03/12/2018, 9:53 AM

## 2018-03-12 NOTE — Discharge Summary (Signed)
Obstetric Discharge Summary Reason for Admission: onset of labor Prenatal Procedures: none Intrapartum Procedures: spontaneous vaginal delivery and GBS prophylaxis Postpartum Procedures: none Complications-Operative and Postpartum: 2nd degree perineal laceration Hemoglobin  Date Value Ref Range Status  03/12/2018 10.9 (L) 12.0 - 15.0 g/dL Final   HCT  Date Value Ref Range Status  03/12/2018 33.1 (L) 36.0 - 46.0 % Final    Physical Exam:  General: alert, cooperative and no distress Lochia: appropriate Uterine Fundus: firm Incision: healing well DVT Evaluation: No evidence of DVT seen on physical exam.  Discharge Diagnoses: Term Pregnancy-delivered  Discharge Information: Date: 03/12/2018 Activity: pelvic rest Diet: routine Medications: PNV and Ibuprofen Condition: stable Instructions: refer to practice specific booklet Discharge to: home Follow-up Information    Marlinda Mike, CNM. Schedule an appointment as soon as possible for a visit in 2 week(s).   Specialty:  Obstetrics and Gynecology Contact information: [address] [place] [address] [phone]           Newborn Data: Live born female  Birth Weight: 10 lb 8.1 oz (4765 g) APGAR: 9, 9  Newborn Delivery   Birth date/time:  03/11/2018 12:21:00 Delivery type:  Vaginal, Spontaneous     Home with mother.  Marlinda Mike 03/12/2018, 11:07 AM

## 2018-03-12 NOTE — Lactation Note (Signed)
This note was copied from a baby's chart. Lactation Consultation Note  Patient Name: Leslie Wall [id number] Date: 03/12/2018 Reason for consult: Follow-up assessment Mom reports feedings are going well and baby is eating frequently.  Denies questions or concerns at present.  Instructed to feed with cues and call for assist prn.  Maternal Data    Feeding Feeding Type: Breast Fed Length of feed: 10 min  LATCH Score Latch: Grasps breast easily, tongue down, lips flanged, rhythmical sucking.  Audible Swallowing: Spontaneous and intermittent  Type of Nipple: Everted at rest and after stimulation  Comfort (Breast/Nipple): Soft / non-tender  Hold (Positioning): No assistance needed to correctly position infant at breast.  LATCH Score: 10  Interventions    Lactation Tools Discussed/Used     Consult Status Consult Status: Complete Follow-up type: Call as needed    Huston Foley 03/12/2018, 1:54 PM

## 2018-03-14 ENCOUNTER — Inpatient Hospital Stay (HOSPITAL_COMMUNITY)
Admission: RE | Admit: 2018-03-14 | Discharge: 2018-03-14 | Disposition: A | Discharge status: Home / Self Care | Payer: BLUE CROSS/BLUE SHIELD | Source: Ambulatory Visit | Attending: Obstetrics and Gynecology | Admitting: Obstetrics and Gynecology

## 2018-03-18 ENCOUNTER — Inpatient Hospital Stay (HOSPITAL_COMMUNITY): Payer: BLUE CROSS/BLUE SHIELD

## 2018-08-11 ENCOUNTER — Ambulatory Visit: Payer: BLUE CROSS/BLUE SHIELD | Admitting: Orthotics

## 2018-08-11 ENCOUNTER — Ambulatory Visit (INDEPENDENT_AMBULATORY_CARE_PROVIDER_SITE_OTHER): Payer: BLUE CROSS/BLUE SHIELD | Admitting: Podiatry

## 2018-08-11 VITALS — BP 125/70 | HR 64

## 2018-08-11 DIAGNOSIS — M722 Plantar fascial fibromatosis: Secondary | ICD-10-CM

## 2018-08-11 NOTE — Progress Notes (Signed)
Subjective:   Patient ID: Leslie Wall, female   DOB: 37 y.o.   MRN: [id number]   HPI 37 year old female presents the office today for concerns of overall foot pain on both sides after being on her feet all day at work.  She typically does wear flat shoes at work and dress shoes.  She gets pain and describes overall soreness to her feet at the end of the day.  She also describes a pain she states goes right at the center of her foot at times.  She also has been having some back issues which is been ongoing for several years.  She does use some over-the-counter inserts which does help.  No recent injury no swelling.  No numbness or tingling.   Review of Systems  All other systems reviewed and are negative.  Past Medical History:  Diagnosis Date  . Medical history non-contributory     Past Surgical History:  Procedure Laterality Date  . NO PAST SURGERIES       Current Outpatient Medications:  .  Choriogonadotropin Alfa (OVIDREL) 250 MCG/0.5ML injection, , Disp: , Rfl:  .  clobetasol (TEMOVATE) 0.05 % external solution, Apply topically., Disp: , Rfl:  .  estradiol (VIVELLE-DOT) 0.1 MG/24HR patch, Apply to lower abdomen or top of buttocks.  Wear continuously and change every 3 days., Disp: , Rfl:  .  Follitropin Alfa (GONAL-F RFF REDIJECT) 900 UNIT/1.5ML SOLN, , Disp: , Rfl:  .  Ganirelix Acetate 250 MCG/0.5ML SOLN, , Disp: , Rfl:  .  human chorionic gonadotropin (PREGNYL) 40981 units injection, , Disp: , Rfl:  .  acetaminophen (TYLENOL) 325 MG tablet, Take 650 mg by mouth every 6 (six) hours as needed., Disp: , Rfl:  .  ACIDOPHILUS LACTOBACILLUS PO, Take by mouth., Disp: , Rfl:  .  CRANBERRY EXTRACT PO, Take by mouth., Disp: , Rfl:  .  ibuprofen (ADVIL,MOTRIN) 600 MG tablet, Take 1 tablet (600 mg total) by mouth every 6 (six) hours., Disp: 30 tablet, Rfl: 0 .  ketoconazole (NIZORAL) 2 % shampoo, , Disp: , Rfl: 0 .  Prenatal Vit-Fe Fumarate-FA (PRENATAL MULTIVITAMIN) TABS tablet, Take  1 tablet by mouth daily at 12 noon., Disp: , Rfl:   Allergies  Allergen Reactions  . Latex Itching         Objective:  Physical Exam  General: AAO x3, NAD  Dermatological: Skin is warm, dry and supple bilateral. Nails x 10 are well manicured; remaining integument appears unremarkable at this time. There are no open sores, no preulcerative lesions, no rash or signs of infection present.  Vascular: Dorsalis Pedis artery and Posterior Tibial artery pedal pulses are 2/4 bilateral with immedate capillary fill time. There is no pain with calf compression, swelling, warmth, erythema.   Neruologic: Grossly intact via light touch bilateral. Vibratory intact via tuning fork bilateral. Protective threshold with Semmes Wienstein monofilament intact to all pedal sites bilateral.  Negative Tinel sign.  Musculoskeletal: Mild depression of the medial arch upon weightbearing.  There is no area pinpoint bony tenderness identified today there is no area of tenderness.  Flexor, extensor, Achilles tendon, plantar fascia appear to be intact.  No pain with lateral compression of the calcaneus.  Upon palpation of the medial band plantar fascia the arch of the foot she does subjectively get discomfort in this area at the end of the day.  Muscular strength 5/5 in all groups tested bilateral.  Gait: Unassisted, Nonantalgic.      Assessment:   37 year old female  with plantar fasciitis, bilateral foot pain    Plan:  -Treatment options discussed including all alternatives, risks, and complications -Etiology of symptoms were discussed -We discussed various treatment options.  We discussed custom orthotics and I discussed with her more of a dress orthotic in order to help assess the type issues that she is wearing mostly.  We will check orthotic coverage for her but she was molded today by Misty Stanley.  We discussed general stretching, rehab exercises as well.  Vivi Barrack DPM

## 2018-08-11 NOTE — Progress Notes (Signed)
Patient cast for CMFO by Lisa/Dr. Ardelle Anton.  Dr Ardelle Anton to drop charges.  I didn't see patient.

## 2018-09-02 ENCOUNTER — Ambulatory Visit: Payer: BLUE CROSS/BLUE SHIELD | Admitting: Orthotics

## 2018-09-02 DIAGNOSIS — M722 Plantar fascial fibromatosis: Secondary | ICD-10-CM

## 2018-09-20 NOTE — Progress Notes (Signed)
Patient came in today to pick up custom made foot orthotics.  The goals were accomplished and the patient reported no dissatisfaction with said orthotics.  Patient was advised of breakin period and how to report any issues. 

## 2019-11-03 ENCOUNTER — Other Ambulatory Visit: Payer: Self-pay | Admitting: Chiropractor

## 2019-11-03 DIAGNOSIS — M545 Low back pain, unspecified: Secondary | ICD-10-CM

## 2019-11-03 DIAGNOSIS — M79606 Pain in leg, unspecified: Secondary | ICD-10-CM

## 2019-11-09 ENCOUNTER — Ambulatory Visit
Admission: RE | Admit: 2019-11-09 | Discharge: 2019-11-09 | Disposition: A | Discharge status: Home / Self Care | Payer: Managed Care, Other (non HMO) | Source: Ambulatory Visit | Attending: Chiropractor | Admitting: Chiropractor

## 2019-11-09 ENCOUNTER — Other Ambulatory Visit: Payer: Self-pay

## 2019-11-09 DIAGNOSIS — M545 Low back pain, unspecified: Secondary | ICD-10-CM

## 2019-11-09 DIAGNOSIS — M79606 Pain in leg, unspecified: Secondary | ICD-10-CM

## 2019-11-19 ENCOUNTER — Other Ambulatory Visit: Payer: Self-pay

## 2019-11-19 ENCOUNTER — Encounter: Payer: Self-pay | Admitting: Family Medicine

## 2019-11-19 ENCOUNTER — Ambulatory Visit (INDEPENDENT_AMBULATORY_CARE_PROVIDER_SITE_OTHER): Payer: Managed Care, Other (non HMO) | Admitting: Family Medicine

## 2019-11-19 DIAGNOSIS — G8929 Other chronic pain: Secondary | ICD-10-CM | POA: Diagnosis not present

## 2019-11-19 DIAGNOSIS — M545 Low back pain, unspecified: Secondary | ICD-10-CM

## 2019-11-19 NOTE — Progress Notes (Signed)
L4-5; right SI  PT  Prolo?    Office Visit Note   Patient: Leslie Wall           Date of Birth: [birth year]           MRN: [id number] Visit Date: 11/19/2019 Requested by: No referring provider defined for this encounter. PCP: Patient, No Pcp Per  Subjective: Chief Complaint  Patient presents with  . Lower Back - Pain    Pain in the lower back x 18 months. Started toward the end of her pregnancy (11 lbs baby girl) and worsened after childbirth. Referred by Dr. Mayford Knife - pain has gone from an 8 to a 4 with laser tx there. Still cannot be as active as she would like.    HPI: She is here at the request of Dr. Mayford Knife for low back and right posterior hip pain.  She has had intermittent problems with her low back over the years and has done well with chiropractic treatment.  About 18 months ago she had a baby, an 11 pound girl delivered vaginally.  About 3 weeks later she started noticing increasing pain in her back.  She has been going to Dr. Mayford Knife but unfortunately her pain does not seem to be going away quite like he used to.  It has improved significantly with laser therapy, from an 8/10 down to a 5/10 but she seems to have reached a plateau.  She denies any sciatica symptoms, denies any bowel or bladder dysfunction, denies any weakness or numbness in her leg.  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  Her father has back problems but has not had surgery.  There is no family history of rheumatologic disease to her knowledge.               ROS:   All other systems were reviewed and are negative.  Objective: Vital Signs: There were no vitals taken for this visit.  Physical Exam:  General:  Alert and oriented, in no acute distress. Pulm:  Breathing unlabored. Psy:  Normal mood, congruent affect. Skin: No rash. Low back: She has pretty good flexibility with forward flexion.  She has pain with  1 leg hyperextension on both sides, but the pain continues to be localized to the right SI joint area.  The SI joint seems to be the most tender spot today but she is also a little bit tender superior to it.  No pain in the sciatic notch, negative straight leg raise, good hip range of motion bilaterally with no pain.  Lower extremity strength and reflexes are normal.  Imaging: None today but we reviewed her recent MRI images on computer.  She has L4-5 and L5-S1 degenerative disc disease with disc bulging at both levels causing slight narrowing of the foramen but no nerve impingement.  Assessment & Plan: 1.  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 -Discussed options with her, she would really like to avoid any aggressive measures at this point.  She wants to try physical therapy so we will arrange this at [facility]. -If she fails to improve, could contemplate dextrose prolotherapy into the right SI joint. -She will keep in touch to let me know how she is doing, and I will see her back as needed.     Procedures: No procedures performed  No notes  on file     PMFS History: Patient Active Problem List   Diagnosis Date Noted  . SVD (spontaneous vaginal delivery) 03/11/2018  . Postpartum care following vaginal delivery (5/29) 03/11/2018  . Normal labor 07/16/2016  . Pregnancy resulting from in vitro fertilization in first trimester 11/27/2015  . Vulvovaginitis 10/23/2015  . Infertility management 05/30/2014   Past Medical History:  Diagnosis Date  . Medical history non-contributory     History reviewed. No pertinent family history.  Past Surgical History:  Procedure Laterality Date  . NO PAST SURGERIES     Social History   Occupational History  . Not on file  Tobacco Use  . Smoking status: Never Smoker  . Smokeless tobacco: Never Used  Substance and Sexual Activity  . Alcohol use: No    . Drug use: No  . Sexual activity: Yes

## 2020-04-03 LAB — OB RESULTS CONSOLE HEPATITIS B SURFACE ANTIGEN: Hepatitis B Surface Ag: NEGATIVE

## 2020-04-03 LAB — OB RESULTS CONSOLE RPR: RPR: NONREACTIVE

## 2020-04-03 LAB — OB RESULTS CONSOLE RUBELLA ANTIBODY, IGM: Rubella: IMMUNE

## 2020-04-10 LAB — OB RESULTS CONSOLE RUBELLA ANTIBODY, IGM: Rubella: IMMUNE

## 2020-04-10 LAB — OB RESULTS CONSOLE RPR: RPR: NONREACTIVE

## 2020-04-10 LAB — OB RESULTS CONSOLE HEPATITIS B SURFACE ANTIGEN: Hepatitis B Surface Ag: NEGATIVE

## 2020-09-28 LAB — OB RESULTS CONSOLE GBS: GBS: POSITIVE

## 2020-10-14 NOTE — L&D Delivery Note (Signed)
Delivery Note:   [id number] at [redacted]w[redacted]d  Admitting diagnosis: Encounter for induction of labor [Z34.90] Risks:  IVF (female factor) DiDi twins, concordant PTL at 33 wks, advanced dilation, BMZ x 2 GBS positive  Onset of labor: membrane sweep 10 am, active labor after AROM at 1500 IOL/Augmentation: AROM ROM: 1454  Complete dilation at  1640, anterior lip reduced with one contraction. Onset of pushing at 1640 FHR second stage Category I x 2  Analgesia /Anesthesia intrapartum:  None   Pushing in L lateral position with CNM and L&D staff support, Dr. Conni Elliot on standby, [name] (FOB) and  Maralyn Sago (doula) present for birth and supportive.  Delivery of a    Sharryn, Belding [[id number]]  Live born female  Birth Weight: 3776 g Weight:, English: 8 lb 5.2 oz  APGAR: 8, 9  Newborn Delivery   Birth date/time: 10/30/2020 16:49:00 Delivery type: Vaginal, Spontaneous     in cephalic presentation, position OA to LOT. Baby vigorous with strong cry at birth, placed at mother's side.   Bedside sono for twin B cephalic presentation confirmed.  AROM at 1652 for clear AF, vertex brought on perineum with next contraction.     Aylani, Spurlock [[id number]]  Live born female  Birth Weight:  3405 g Weight:, English: 7 lb 8.1 oz APGAR: 8, 9  Newborn Delivery   Birth date/time: 10/30/2020 16:56:00 Delivery type: Vaginal, Spontaneous      in cephalic presentation, position OA to ROT.   Nuchal Cord: No  Cord double clamped after cessation of pulsation, cut by FOB x 2.  Collection of cord blood for typing completed x 2.   Placenta delivered-   Emmarie, Sannes [[id number]]  Spontaneous    Mashell, Sieben [[id number]]  Spontaneous   with    Stacia, Feazell [[id number]]  3 vessels    Kyrsten, Deleeuw [[id number]]  3 vessels  . Uterotonics: Pitocin IV bolus after Twin B Placenta to L&D for disposal. Uterine tone firm, bleeding small.   Small 1st degree laceration  identified. Hemostatic, patient declines repair.  Episiotomy: none  Est. Blood Loss (mL): 300  Complications: None   Mom to postpartum.  Babies to Couplet care / Skin to Skin.  Delivery Report:  Review the Delivery Report for details.     Signed: Neta Mends, CNM, MSN 10/30/2020, 5:22 PMl

## 2020-10-27 ENCOUNTER — Other Ambulatory Visit: Payer: Self-pay

## 2020-10-27 ENCOUNTER — Telehealth (HOSPITAL_COMMUNITY): Payer: Self-pay | Admitting: *Deleted

## 2020-10-27 ENCOUNTER — Encounter (HOSPITAL_COMMUNITY): Payer: Self-pay | Admitting: *Deleted

## 2020-10-27 NOTE — Telephone Encounter (Signed)
Preadmission screen  

## 2020-10-28 ENCOUNTER — Other Ambulatory Visit (HOSPITAL_COMMUNITY)
Admission: RE | Admit: 2020-10-28 | Discharge: 2020-10-28 | Disposition: A | Discharge status: Home / Self Care | Payer: Managed Care, Other (non HMO) | Source: Ambulatory Visit | Attending: Obstetrics and Gynecology | Admitting: Obstetrics and Gynecology

## 2020-10-28 DIAGNOSIS — Z01812 Encounter for preprocedural laboratory examination: Secondary | ICD-10-CM | POA: Insufficient documentation

## 2020-10-28 DIAGNOSIS — Z20822 Contact with and (suspected) exposure to covid-19: Secondary | ICD-10-CM | POA: Insufficient documentation

## 2020-10-28 LAB — SARS CORONAVIRUS 2 (TAT 6-24 HRS): SARS Coronavirus 2: NEGATIVE

## 2020-10-30 ENCOUNTER — Inpatient Hospital Stay (HOSPITAL_COMMUNITY)
Admission: AD | Admit: 2020-10-30 | Discharge: 2020-10-31 | DRG: 807 | Disposition: A | Discharge status: Home / Self Care | Payer: Managed Care, Other (non HMO) | Attending: Obstetrics and Gynecology | Admitting: Obstetrics and Gynecology

## 2020-10-30 ENCOUNTER — Encounter (HOSPITAL_COMMUNITY): Payer: Self-pay

## 2020-10-30 ENCOUNTER — Inpatient Hospital Stay (HOSPITAL_COMMUNITY): Payer: Managed Care, Other (non HMO)

## 2020-10-30 DIAGNOSIS — Z20822 Contact with and (suspected) exposure to covid-19: Secondary | ICD-10-CM | POA: Diagnosis present

## 2020-10-30 DIAGNOSIS — O99824 Streptococcus B carrier state complicating childbirth: Principal | ICD-10-CM | POA: Diagnosis present

## 2020-10-30 DIAGNOSIS — O30043 Twin pregnancy, dichorionic/diamniotic, third trimester: Secondary | ICD-10-CM | POA: Diagnosis present

## 2020-10-30 DIAGNOSIS — O26893 Other specified pregnancy related conditions, third trimester: Secondary | ICD-10-CM | POA: Diagnosis present

## 2020-10-30 DIAGNOSIS — Z3A38 38 weeks gestation of pregnancy: Secondary | ICD-10-CM | POA: Diagnosis not present

## 2020-10-30 DIAGNOSIS — Z3490 Encounter for supervision of normal pregnancy, unspecified, unspecified trimester: Secondary | ICD-10-CM | POA: Diagnosis present

## 2020-10-30 LAB — CBC
HCT: 38.9 % (ref 36.0–46.0)
Hemoglobin: 13 g/dL (ref 12.0–15.0)
MCH: 31.4 pg (ref 26.0–34.0)
MCHC: 33.4 g/dL (ref 30.0–36.0)
MCV: 94 fL (ref 80.0–100.0)
Platelets: 148 10*3/uL — ABNORMAL LOW (ref 150–400)
RBC: 4.14 MIL/uL (ref 3.87–5.11)
RDW: 12.6 % (ref 11.5–15.5)
WBC: 9.5 10*3/uL (ref 4.0–10.5)
nRBC: 0 % (ref 0.0–0.2)

## 2020-10-30 LAB — TYPE AND SCREEN
ABO/RH(D): A POS
Antibody Screen: NEGATIVE

## 2020-10-30 LAB — RPR: RPR Ser Ql: NONREACTIVE

## 2020-10-30 MED ORDER — OXYTOCIN-SODIUM CHLORIDE 30-0.9 UT/500ML-% IV SOLN: 1.0000 m[IU]/min | INTRAVENOUS | Status: DC

## 2020-10-30 MED ORDER — IBUPROFEN 600 MG PO TABS
600.0000 mg | ORAL_TABLET | Freq: Four times a day (QID) | ORAL | Status: DC
  Administered 2020-10-31 (×3): 600 mg via ORAL
  Filled 2020-10-30 (×4): qty 1

## 2020-10-30 MED ORDER — ONDANSETRON HCL 4 MG/2ML IJ SOLN: 4.0000 mg | INTRAMUSCULAR | Status: DC | PRN

## 2020-10-30 MED ORDER — COCONUT OIL OIL: 1.0000 "application " | TOPICAL_OIL | Status: DC | PRN

## 2020-10-30 MED ORDER — BENZOCAINE-MENTHOL 20-0.5 % EX AERO
1.0000 "application " | INHALATION_SPRAY | CUTANEOUS | Status: DC | PRN
  Administered 2020-10-31: 1 via TOPICAL
  Filled 2020-10-30: qty 56

## 2020-10-30 MED ORDER — OXYTOCIN-SODIUM CHLORIDE 30-0.9 UT/500ML-% IV SOLN: 20.0000 [IU] | Freq: Once | INTRAVENOUS | Status: AC

## 2020-10-30 MED ORDER — ACETAMINOPHEN 325 MG PO TABS: 650.0000 mg | ORAL_TABLET | ORAL | Status: DC | PRN

## 2020-10-30 MED ORDER — SENNOSIDES-DOCUSATE SODIUM 8.6-50 MG PO TABS
2.0000 | ORAL_TABLET | ORAL | Status: DC
  Administered 2020-10-31: 2 via ORAL
  Filled 2020-10-30: qty 2

## 2020-10-30 MED ORDER — OXYTOCIN 10 UNIT/ML IJ SOLN
10.0000 [IU] | Freq: Once | INTRAMUSCULAR | Status: DC
  Filled 2020-10-30: qty 1

## 2020-10-30 MED ORDER — SODIUM CHLORIDE 0.9 % IV SOLN
5.0000 10*6.[IU] | Freq: Once | INTRAVENOUS | Status: AC
  Administered 2020-10-30: 5 10*6.[IU] via INTRAVENOUS
  Filled 2020-10-30: qty 5

## 2020-10-30 MED ORDER — PENICILLIN G POT IN DEXTROSE 60000 UNIT/ML IV SOLN
3.0000 10*6.[IU] | INTRAVENOUS | Status: DC
  Administered 2020-10-30 (×2): 3 10*6.[IU] via INTRAVENOUS
  Filled 2020-10-30 (×2): qty 50

## 2020-10-30 MED ORDER — DIPHENHYDRAMINE HCL 25 MG PO CAPS: 25.0000 mg | ORAL_CAPSULE | Freq: Four times a day (QID) | ORAL | Status: DC | PRN

## 2020-10-30 MED ORDER — FLEET ENEMA 7-19 GM/118ML RE ENEM: 1.0000 | ENEMA | Freq: Every day | RECTAL | Status: DC | PRN

## 2020-10-30 MED ORDER — TETANUS-DIPHTH-ACELL PERTUSSIS 5-2.5-18.5 LF-MCG/0.5 IM SUSY: 0.5000 mL | PREFILLED_SYRINGE | Freq: Once | INTRAMUSCULAR | Status: DC

## 2020-10-30 MED ORDER — ZOLPIDEM TARTRATE 5 MG PO TABS: 5.0000 mg | ORAL_TABLET | Freq: Every evening | ORAL | Status: DC | PRN

## 2020-10-30 MED ORDER — WITCH HAZEL-GLYCERIN EX PADS: 1.0000 "application " | MEDICATED_PAD | CUTANEOUS | Status: DC | PRN

## 2020-10-30 MED ORDER — BISACODYL 10 MG RE SUPP: 10.0000 mg | Freq: Every day | RECTAL | Status: DC | PRN

## 2020-10-30 MED ORDER — ONDANSETRON HCL 4 MG/2ML IJ SOLN: 4.0000 mg | Freq: Four times a day (QID) | INTRAMUSCULAR | Status: DC | PRN

## 2020-10-30 MED ORDER — ONDANSETRON HCL 4 MG PO TABS: 4.0000 mg | ORAL_TABLET | ORAL | Status: DC | PRN

## 2020-10-30 MED ORDER — SIMETHICONE 80 MG PO CHEW: 80.0000 mg | CHEWABLE_TABLET | ORAL | Status: DC | PRN

## 2020-10-30 MED ORDER — ACETAMINOPHEN 500 MG PO TABS
1000.0000 mg | ORAL_TABLET | Freq: Four times a day (QID) | ORAL | Status: DC
  Administered 2020-10-31: 1000 mg via ORAL
  Filled 2020-10-30 (×3): qty 2

## 2020-10-30 MED ORDER — TERBUTALINE SULFATE 1 MG/ML IJ SOLN: 0.2500 mg | Freq: Once | INTRAMUSCULAR | Status: DC | PRN

## 2020-10-30 MED ORDER — LIDOCAINE HCL (PF) 1 % IJ SOLN: 30.0000 mL | INTRAMUSCULAR | Status: DC | PRN

## 2020-10-30 MED ORDER — PRENATAL MULTIVITAMIN CH
1.0000 | ORAL_TABLET | Freq: Every day | ORAL | Status: DC
  Filled 2020-10-30: qty 1

## 2020-10-30 MED ORDER — DIBUCAINE (PERIANAL) 1 % EX OINT: 1.0000 "application " | TOPICAL_OINTMENT | CUTANEOUS | Status: DC | PRN

## 2020-10-30 MED ORDER — OXYTOCIN-SODIUM CHLORIDE 30-0.9 UT/500ML-% IV SOLN
INTRAVENOUS | Status: AC
  Administered 2020-10-30: 20 [IU] via INTRAVENOUS
  Filled 2020-10-30: qty 500

## 2020-10-30 MED ORDER — SOD CITRATE-CITRIC ACID 500-334 MG/5ML PO SOLN: 30.0000 mL | ORAL | Status: DC | PRN

## 2020-10-30 NOTE — Plan of Care (Signed)
  Problem: Education: Goal: Knowledge of Childbirth will improve Outcome: Progressing   Problem: Coping: Goal: Ability to verbalize concerns and feelings about labor and delivery will improve Outcome: Progressing   Problem: Coping: Goal: Level of anxiety will decrease Outcome: Progressing   Problem: Safety: Goal: Ability to remain free from injury will improve Outcome: Progressing   Pt resting in bed, antibiotics for GBS are infusing. Pt on intermittent monitoring protocol. No questions at this time.

## 2020-10-30 NOTE — H&P (Signed)
OB ADMISSION/ HISTORY & PHYSICAL:  Admission Date: 10/30/2020  7:42 AM  Admit Diagnosis: Encounter for induction of labor [Z34.90]    Leslie Wall is a 40 y.o. female presenting for IOL at term. Reports irregular contractions, no LOF/VB, good FM x 2. Denies HA/NV/RUQ pain/visual changes.  Desires unmedicated birth, spouse Ines Bloomer present and supportive.  Previous unmedicated births x 2, pelvis proven 10+ lbs.  Prenatal History: [id number]   EDC : 11/10/2020, by FET date. Prenatal care at [facility] since 9 wks.   Prenatal course complicated by: IVF (female factor) DiDi twins, concordant PTL at 33 wks, advanced dilation, BMZ x 2 GBS positive  Prenatal Labs: ABO, Rh:   A pos Antibody: NEG (01/17 0751) Rubella:   imm RPR:   Neg HBsAg:   neg HIV:   neg GBS:   pos 1 hr Glucola : 150, 2 wks self screening wnl, no GDM Genetic Screening: declined Ultrasound: normal anatomy x 2, AGA, DiDi twins, anterior and posterior placentas  Vaccines: TDaP          UTD         Flu             declined                    COVID-19 declined    Maternal Diabetes: No Genetic Screening: Declined Maternal Ultrasounds/Referrals: Normal Fetal Ultrasounds or other Referrals:  None Maternal Substance Abuse:  No Significant Maternal Medications:  None Significant Maternal Lab Results:  Group B Strep positive Other Comments:  None  Medical / Surgical History :  Past medical history:  Past Medical History:  Diagnosis Date  . Gestational diabetes      Past surgical history:  Past Surgical History:  Procedure Laterality Date  . NO PAST SURGERIES       Family History:  Family History  Problem Relation Age of Onset  . Hypertension Father   . Cancer Maternal Grandfather      Social History:  reports that she has never smoked. She has never used smokeless tobacco. She reports that she does not drink alcohol and does not use drugs.   Allergies: Latex   Current Medications  at time of admission:  Medications Prior to Admission  Medication Sig Dispense Refill Last Dose  . Cholecalciferol (VITAMIN D-3 PO) Take by mouth daily.   10/29/2020 at Unknown time  . CRANBERRY EXTRACT PO Take by mouth.   10/29/2020 at Unknown time  . Prenatal Vit-Fe Fumarate-FA (PRENATAL MULTIVITAMIN) TABS tablet Take 1 tablet by mouth daily at 12 noon.   10/29/2020 at Unknown time  . acetaminophen (TYLENOL) 325 MG tablet Take 650 mg by mouth every 6 (six) hours as needed.     . clobetasol (TEMOVATE) 0.05 % external solution Apply topically.     Marland Kitchen ibuprofen (ADVIL,MOTRIN) 600 MG tablet Take 1 tablet (600 mg total) by mouth every 6 (six) hours. 30 tablet 0   . ketoconazole (NIZORAL) 2 % shampoo   0   . norethindrone (MICRONOR) 0.35 MG tablet Take 1 tablet by mouth daily.     . Omega-3 1000 MG CAPS Take by mouth.        Review of Systems: ROS As noted above Physical Exam: Vital signs and nursing notes reviewed.  Patient Vitals for the past 24 hrs:  BP Temp Temp src Pulse Resp SpO2 Height Weight  10/30/20 0800 116/75 98 F (36.7 C) Oral 86 18 99 % 5'  7" (1.702 m) 83.3 kg     General: AAO x 3, NAD, coping well Heart: RRR Lungs:CTAB Abdomen: Gravid, NT Extremities: +1 pedal edema Genitalia / VE: Dilation: 5 Effacement (%): 80 Station: -1 Exam by:: Daniela CNM  Membrane sweep done.   Bedside sono Vtx/Vtx, Twin A presenting on maternal L Twin B on R  FHR: "A" 150 BPM, mod variability, + accels, no decels  "B" 140 BPM mod var, + accels, no decels TOCO: Ctx irregular  Labs:   Pending T&S, RPR  Recent Labs    10/30/20 0751  WBC 9.5  HGB 13.0  HCT 38.9  PLT 148*     Assessment:  40 y.o. G3P2002 at [redacted]w[redacted]d DiDi vtx/vtx twins for IOL at term S/P BMZ x 2 for 33 wks PTL Advanced dilation IVF pregnancy  1. Latent stage of labor 2. FHR category I x 2 3. GBS positive 4. Desires unmedicated/low intervention birth 5. Breastfeeding 6. Placenta disposal per patient  request  Plan:  1. Admit to BS 2. Routine L&D orders, PCN prophylaxis for GBS  3. Analgesia/anesthesia PRN  4. Membrane sweep then AROM after 2nd dose PCN 5. Anticipate NSVB   Dr Billy Coast notified of admission / plan of care   Neta Mends CNM, MSN 10/30/2020, 10:19 AM

## 2020-10-30 NOTE — Lactation Note (Signed)
This note was copied from a baby's chart. Lactation Consultation Note  Patient Name: Leslie Wall [id number] Date: 10/30/2020 Reason for consult: Mother's request;Difficult latch;Term;L&D Initial assessment (AMA, IVF) Age:39 hours  Twin A latched according to RN prior to visit. Mom states she latched for 45 minutes. LC was not able to assess the latch as infant was asleep upon arrival.   Twin B sleepy at the breast and not able to latch. Mom had infant in cradle but she was not able to grasp nipple which was erect but soft and compressible. LC changed position to football and offered drops of colostrum on the lips with hand expression. Infant able to latch and showed signs of milk transfer with a tea cup hold demonstrated to Mom.  Plan to feed based on cues 8-12 x in 24 hours no more than 4 hours without an attempt.  LC stated Mom will receive further LC support on the floor.  Infant still latched and feeding at the end of the visit.

## 2020-10-30 NOTE — Progress Notes (Signed)
S: Working with ctx for past hour, using BB at bedside. Leslie Wall and spouse [name] at [facility] and supportive.   O: Vitals:   10/30/20 0800 10/30/20 1315  BP: 116/75 116/78  Pulse: 86 98  Resp: 18 16  Temp: 98 F (36.7 C) 98 F (36.7 C)  TempSrc: Oral Oral  SpO2: 99%   Weight: 83.3 kg   Height: 5\' 7"  (1.702 m)      FHT:  A - 140 BPM, mod var, + accels, no decels  B - 135, mod var, + accels, no decels UC:   irregular, every 3-5 minutes, some irritability  SVE:   Dilation: 5 Effacement (%): 80 Station: -1 Exam by:: Daniela CNM AROM, twin A, clear AF, vertex  A / P: Protracted latent phase  Twins DiDi GBS positive S/P 2 doses PCN IV Fetal Wellbeing:  Category I x 2 Pain Control:  Labor support without medications  Anticipated MOD:  NSVD AROM augmentation and anticipate physiologic labor Pitocin PRN Plan AMTSL  Dr. [phone] updated w/ POC, will call for standby with transition labor stage/  Conni Elliot, CNM, MSN 10/30/2020, 3:04 PM

## 2020-10-30 NOTE — Lactation Note (Addendum)
This note was copied from a baby's chart. Lactation Consultation Note  Patient Name: Leslie Wall Date: 10/30/2020 Reason for consult: Mother's request;Difficult latch;Term;L&D Initial assessment (AMA, IVF) Gestational Diabetes  Age:39 hours  LC on arrival Mom stated both infants just completed a feeding. Infant B still only able to latch for 5-6 minutes for the last two feedings. Mom encouraged to do breast massage and hand expression before latching. Also to offer EBM via spoon  if unable to get infant to latch at the breasts.   Mom experienced with nursing, she breast fed her last two children for 14 months.   LC attempted latching infant in L and D, baby B required a tea cup hold and football position to get her to latch. Mom will continue to offer her breasts first and will pre pump to  increase flow before latching.   Plan 1. To feed based on cues 8-12 x in 24 hour period no more than 4 hours without an attempt.          2. Place infants STS and look for signs of milk transfer with breast compression during feeding.           3. Mom provided with snappie and spoon to offer EBM via spoon or finger feeding if unable to get her to latch.            4 I and O sheet reviewed with parents.            5 LC brochure of inpatient and outpatient services provided             6 Mom to pump using the  DEBP q 3 hours for 15 minutes.             All questions answered at the end of the feed.   DEBP set up. Parts, cleaning, assembly and milk storage reviewed.  LC returned and assisted Mom getting infant B to latch in football. 16 NS used to get infant used to opening her mouth wider and flanging her lips. NS removed and infant then able to latch on the left breasts in a semi prone position where she was not able to pop off the latch. Infant latched and showed signs of milk transfer. Mom is aware 16 NS is too small and we only used it once to get infant to open latch wider. Mom denied  having pain with the latch directly to the breast without NS.

## 2020-10-31 LAB — CBC
HCT: 32.8 % — ABNORMAL LOW (ref 36.0–46.0)
Hemoglobin: 11.2 g/dL — ABNORMAL LOW (ref 12.0–15.0)
MCH: 31.9 pg (ref 26.0–34.0)
MCHC: 34.1 g/dL (ref 30.0–36.0)
MCV: 93.4 fL (ref 80.0–100.0)
Platelets: 130 10*3/uL — ABNORMAL LOW (ref 150–400)
RBC: 3.51 MIL/uL — ABNORMAL LOW (ref 3.87–5.11)
RDW: 12.6 % (ref 11.5–15.5)
WBC: 13.4 10*3/uL — ABNORMAL HIGH (ref 4.0–10.5)
nRBC: 0 % (ref 0.0–0.2)

## 2020-10-31 MED ORDER — COCONUT OIL OIL: 1.0000 "application " | TOPICAL_OIL | 0 refills | Status: AC | PRN

## 2020-10-31 MED ORDER — ACETAMINOPHEN 500 MG PO TABS
1000.0000 mg | ORAL_TABLET | Freq: Four times a day (QID) | ORAL | 0 refills | Status: AC
Start: 2020-10-31 — End: ?

## 2020-10-31 MED ORDER — BENZOCAINE-MENTHOL 20-0.5 % EX AERO: 1.0000 "application " | INHALATION_SPRAY | CUTANEOUS | Status: AC | PRN

## 2020-10-31 MED ORDER — IBUPROFEN 600 MG PO TABS: 600.0000 mg | ORAL_TABLET | Freq: Four times a day (QID) | ORAL | 0 refills | Status: AC

## 2020-10-31 NOTE — Discharge Summary (Signed)
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 trimester 11/27/2015   Infertility management 05/30/2014   Additional problems:none   Date of discharge: 10/31/2020   Discharge diagnosis: Principal Problem:   Postpartum care following vaginal delivery 1/17 Active Problems:   SVD (spontaneous vaginal delivery) DiDi twins   Encounter for induction of labor                                                              Post partum procedures:none  Augmentation: AROM Pain control:    Elissia, Spiewak [[id number]]  None    Parrish, Daddario [[id number]]  None   Laceration:   Blyss, Lugar [[id number]]  1st degree    Carlene, Bickley [[id number]]     Episiotomy:   Mikyah, Alamo [[id number]]  None    Arrayah, Connors [[id number]]  None   Complications: None  Hospital course:  Induction of Labor With Vaginal Delivery   40 y.o. yo [id number] at [redacted]w[redacted]d was admitted to the hospital 10/30/2020 for induction of labor.  Indication for induction: DiDi twins at term.  Patient had an uncomplicated labor course as follows: Membrane Rupture Time/Date:    Donyell, Carrell [[id number]]  2:54 PM    Flo, Berroa [[id number]]  4:52 PM  ,   Sieara, Bremer [[id number]]  10/30/2020    Ceirra, Belli [[id number]]  10/30/2020    Delivery Method:    Ree Kida [[id number]]  Vaginal, Spontaneous    Tashaya, Ancrum [[id number]]  Vaginal, Spontaneous   Episiotomy:    Tully, Burgo [[id number]]  None    Dinisha, Cai [[id number]]  None   Lacerations:     Malie, Kashani [[id number]]  1st degree    Dominik, Lauricella [[id number]]     Details of delivery can be found in separate delivery note.  Patient had a routine postpartum course. Patient is discharged home 10/31/20.  Newborn Data: Birth date:   Andora, Krull [[id number]]  10/30/2020    Jaielle, Dlouhy [[id number]]  10/30/2020   Birth time:   Aislynn, Cifelli [[id number]]  4:49 PM    Gypsy Balsam [[id number]]  4:56 PM   Gender:   Neala, Miggins [[id number]]  Female    Aino, Heckert [[id number]]  Female   Living status:   Mica, Ramdass [[id number]]  Living    Cornelia, Walraven [[id number]]  Living   Apgars:   Bhavika, Schnider [[id number]]  [address] [[id number]]  8  ,   Meesha, Sek [[id number]]  [address] [place] [[id number]]  9   Weight:   Ree Kida [  [id number]]  3776 g    Jakeira, Seeman [[id number]]  3405 g    Physical exam  Vitals:   10/31/20 0112 10/31/20 0539 10/31/20 0900 10/31/20 1647  BP: 128/63 117/71 112/69 121/70  Pulse: 79 71 77 64  Resp:  16 17 18   Temp: 98.2 F (36.8 C) 98 F (36.7 C) 98.1 F (36.7 C) 98.1 F (36.7 C)  TempSrc: Oral Oral Oral   SpO2: 97% 97% 97% 99%  Weight:      Height:       General: alert, cooperative and no distress Lochia: appropriate Uterine Fundus: firm Incision: N/A Perineum: intact, no edema DVT Evaluation: No significant calf/ankle edema. Labs: Lab Results  Component Value Date   WBC 13.4 (H) 10/31/2020   HGB 11.2 (L) 10/31/2020   HCT 32.8 (L) 10/31/2020   MCV 93.4 10/31/2020   PLT 130 (L) 10/31/2020   CMP Latest Ref Rng & Units 06/04/2017  Glucose  65 - 99 mg/dL 06/06/2017)  BUN 6 - 20 mg/dL 7  Creatinine 778(E - 4.23 mg/dL 5.36  Sodium 1.44 - 315 mmol/L 136  Potassium 3.5 - 5.1 mmol/L 4.2  Chloride 101 - 111 mmol/L 105  CO2 22 - 32 mmol/L 22  Calcium 8.9 - 10.3 mg/dL 9.0  Total Protein - -  Total Bilirubin - -  Alkaline Phos - -  AST - -  ALT - -   Edinburgh Postnatal Depression Scale Screening Tool 10/31/2020 10/30/2020 03/12/2018  I have been able to laugh and see the funny side of things. 0 (No Data) 0  I have looked forward with enjoyment to things. 0 - 0  I have blamed myself unnecessarily when things went wrong. 0 - 0  I have been anxious or worried for no good reason. 0 - 0  I have felt scared or panicky for no good reason. 0 - 0  Things have been getting on top of me. 0 - 0  I have been so unhappy that I have had difficulty sleeping. 0 - 0  I have felt sad or miserable. 0 - 0  I have been so unhappy that I have been crying. 0 - 0  The thought of harming myself has occurred to me. 0 - 0  Edinburgh Postnatal Depression Scale Total 0 - 0   Vaccines: TDaP          UTD         Flu             declined                    COVID-19 declined  Discharge instruction:  per After Visit Summary,  [facility] booklet and  "Understanding Mother & Baby Care" hospital booklet  After Visit Meds:  Allergies as of 10/31/2020      Reactions   Latex Itching      Medication List    TAKE these medications   acetaminophen 500 MG tablet Commonly known as: TYLENOL Take 2 tablets (1,000 mg total) by mouth every 6 (six) hours.   benzocaine-Menthol 20-0.5 % Aero Commonly known as: DERMOPLAST Apply 1 application topically as needed for irritation (perineal discomfort).   coconut oil Oil Apply 1 application topically as needed.   CRANBERRY EXTRACT PO Take by mouth.   ibuprofen 600 MG tablet Commonly known as: ADVIL Take 1 tablet (600 mg total) by mouth every 6 (six) hours.   Omega-3 1000 MG Caps Take by mouth.  prenatal  multivitamin Tabs tablet Take 1 tablet by mouth daily at 12 noon.   VITAMIN D-3 PO Take by mouth daily.            Discharge Care Instructions  (From admission, onward)         Start     Ordered   10/31/20 0000  Discharge wound care:       Comments: Sitz baths 2 times /day with warm water x 1 week. May add herbals: 1 ounce dried comfrey leaf* 1 ounce calendula flowers 1 ounce lavender flowers  Supplies can be found online at [employer] sources at [employer], [employer]  1/2 ounce dried uva ursi leaves 1/2 ounce witch hazel blossoms (if you can find them) 1/2 ounce dried sage leaf 1/2 cup sea salt Directions: Bring 2 quarts of water to a boil. Turn off heat, and place 1 ounce (approximately 1 large handful) of the above mixed herbs (not the salt) into the pot. Steep, covered, for 30 minutes.  Strain the liquid well with a fine mesh strainer, and discard the herb material. Add 2 quarts of liquid to the tub, along with the 1/2 cup of salt. This medicinal liquid can also be made into compresses and peri-rinses.   10/31/20 1840          Diet: routine diet  Activity: Advance as tolerated. Pelvic rest for 6 weeks.   Postpartum contraception: Not Discussed  Newborn Data:   Leesa, Leifheit [[id number]]  Live born female  Birth Weight: 8 lb 5.2 oz (3776 g) APGAR: 8, 9  Newborn Delivery   Birth date/time: 10/30/2020 16:49:00 Delivery type: Vaginal, Spontaneous       Ermie, Glendenning [[id number]]  Live born female  Birth Weight: 7 lb 8.1 oz (3405 g) APGAR: 8, 9  Newborn Delivery   Birth date/time: 10/30/2020 16:56:00 Delivery type: Vaginal, Spontaneous      named Annalyse and Emma Baby Feeding: Breast Disposition:home with mother   Delivery Report:  Review the Delivery Report for details.    Follow up:  Follow-up Information    Neta Mends, CNM. Schedule an appointment as soon as possible for a visit in 2 week(s).    Specialty: Obstetrics and Gynecology Contact information: [address] [place] [address] [phone]                 Signed: Cipriano Mile, MSN 10/31/2020, 6:42 PM

## 2020-10-31 NOTE — Discharge Instructions (Signed)
Lactation outpatient support - home visit  Linda Coppola RN, MHA, IBCLC at [facility]: Lactation Consultant  [email or web address] Mail: [email or web address] Tel: [phone]    Additional resources:  [facility] [email or web address]   Chiropractic specialist   Dr. Leanna Hastings [email or web address]  Craniosacral therapy for baby  Erin Balkind  [email or web address]  

## 2020-10-31 NOTE — Lactation Note (Signed)
This note was copied from a baby's chart. Lactation Consultation Note  Patient Name: Leslie Wall [id number] Date: 10/31/2020 Reason for consult: Follow-up assessment;Early term 37-38.6wks;Other (Comment);Infant weight loss;Multiple gestation (AMA, IVF) Age:39 hours  Visited with mom of ETI twin girls, she's a P3 and experienced BF. Per NP Lauren, both babies are feeding, voiding and stooling well and are safe for discharge. Reviewed discharge instructions, engorgement prevention/treatment and treatment/prevention for sore nipples. Mom told LC that she has a support system including a doula in case she has any questions or concerns after discharge.  Mom also voiced that feedings at the breast are comfortable, but that she noticed minor cracking on her nipples, advised her to use her own colostrum. Mom commented that she used an [facility] during her last pregnancy and that she can always go back to her OBGYN in case she needs it. Parents were packing and getting ready to leave.  Dad present at the time of [facility] consultation. Parents reported all questions and concerns were answered, they're both aware of LC OP services and will contact PRN.   Maternal Data    Feeding Feeding Type: Breast Fed  LATCH Score                   Interventions Interventions: Breast feeding basics reviewed  Lactation Tools Discussed/Used     Consult Status Consult Status: Complete Date: 10/31/20 Follow-up type: Call as needed    Leslie Wall 10/31/2020, 7:50 PM

## 2020-10-31 NOTE — Progress Notes (Signed)
PPD # 1 S/P NSVD    Harleigh, Civello [[id number]] Kelvin Cellar  Live born female  Birth Weight: 8 lb 5.2 oz (3776 g) APGAR: 8, 9  Newborn Delivery   Birth date/time: 10/30/2020 16:49:00 Delivery type: Vaginal, Spontaneous       Kyree, Fedorko [[id number]] Melody Comas  Live born female  Birth Weight: 7 lb 8.1 oz (3405 g) APGAR: 8, 9  Newborn Delivery   Birth date/time: 10/30/2020 16:56:00 Delivery type: Vaginal, Spontaneous      Delivering provider:    Xochil, Shanker [[id number]]  WFUX, NATFTDD U    KGU, RKYHC [id number] [[id number]]  Arlan Organ C   Episiotomy:   Andrew, Soria [[id number]]  None    Amadea, Keagy [[id number]]  None    Lacerations:   Elverna, Caffee [[id number]]  1st degree    Maliah, Pyles [[id number]]      Feeding: breast  Pain control at delivery:    Lilyonna, Steidle [[id number]]  None    Heer, Justiss [[id number]]  None    S:  Reports feeling well, would like DC home today if babies able to be discharge.             Tolerating po/ No nausea or vomiting             Bleeding is light             Pain controlled with ibuprofen (OTC)             Up ad lib / ambulatory / voiding without difficulties   O:  A & O x 3, in no apparent distress              VS:  Vitals:   10/30/20 2123 10/31/20 0112 10/31/20 0539 10/31/20 0900  BP: 108/73 128/63 117/71 112/69  Pulse: 84 79 71 77  Resp: 16  16 17   Temp: 97.6 F (36.4 C) 98.2 F (36.8 C) 98 F (36.7 C) 98.1 F (36.7 C)  TempSrc: Oral Oral Oral Oral  SpO2: 99% 97% 97% 97%  Weight:      Height:        LABS:  Recent Labs    10/30/20 0751 10/31/20 0404  WBC 9.5 13.4*  HGB 13.0 11.2*  HCT 38.9 32.8*  PLT 148* 130*    Blood type: --/--/A POS (01/17 0751)  Rubella: Immune (06/28 0000)   I&O: I/O last 3 completed shifts: In: -  Out: 1000 [Urine:700; Blood:300]          No intake/output data recorded.  Vaccines: TDaP           UTD         Flu             declined                    COVID-19 declined  Gen: AAO x 3, NAD  Abdomen: soft, non-tender, non-distended             Fundus: firm, non-tender, U @  Perineum: intact, no edema  Lochia: small  Extremities: trace pedal edema, no calf pain or tenderness    A/P: PPD # 1 40 y.o., 20   Principal Problem:   Postpartum care following vaginal delivery 1/17 Active Problems:   SVD (spontaneous vaginal delivery) DiDi twins   Encounter for induction of labor   Doing well - stable status  Routine post partum orders  Anticipate  discharge tonight or tomorrow pending babies dispo    Neta Mends, MSN, CNM 10/31/2020, 10:50 AM

## 2020-10-31 NOTE — Progress Notes (Signed)
Discharge time: 2002 

## 2021-08-07 IMAGING — MR MR LUMBAR SPINE W/O CM
5 series · 48 of 48 positions shown · non-contrast
Comparison: None.

CLINICAL DATA: Low back pain with right buttock and leg pain

EXAM:
MRI LUMBAR SPINE WITHOUT CONTRAST
TECHNIQUE: Multiplanar, multisequence MR imaging of the lumbar spine was
performed. No intravenous contrast was administered.

[Series 3: T2 post-contrast · sagittal · 4.0mm · 0.88mm/px · 6 of 12 slices shown]
[im 1/12]
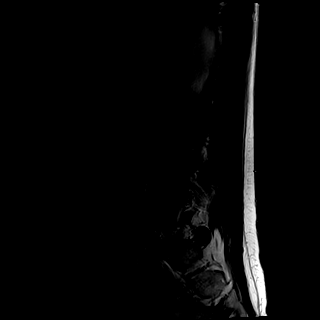
[im 3/12]
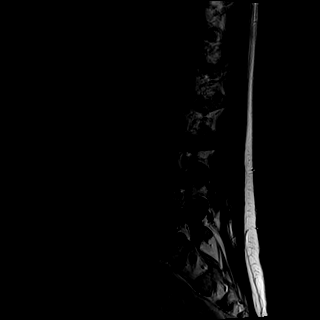
[im 5/12]
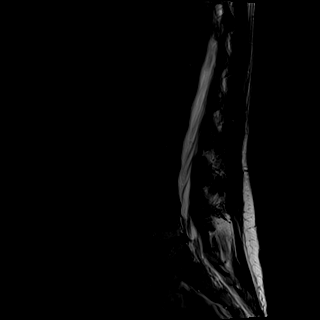
[im 7/12]
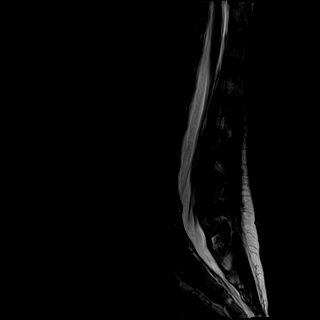
[im 9/12]
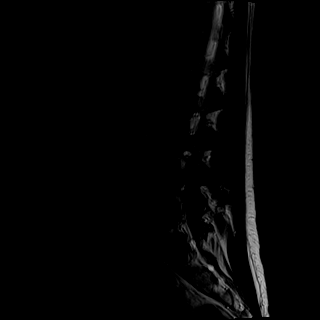
[im 12/12]
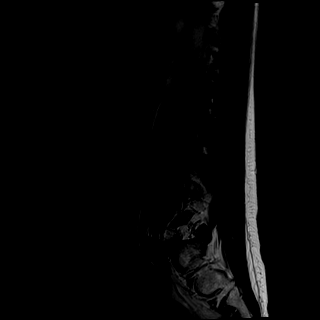

[Series 4: T1 · sagittal · 4.0mm · 0.88mm/px · 5 of 12 slices shown (1 of 2)]
[im 1/12]
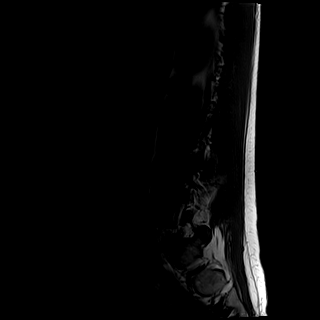
[im 3/12]
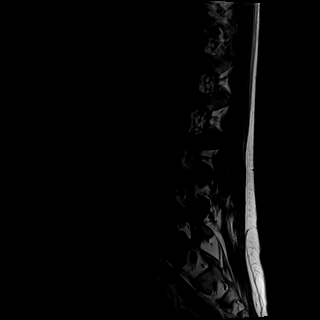
[im 6/12]
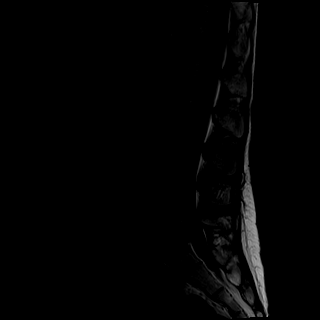
[im 9/12]
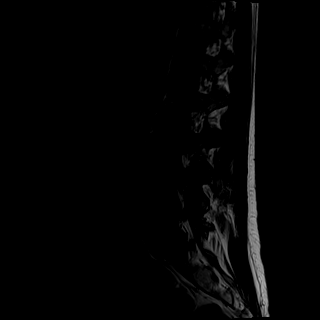
[im 12/12]
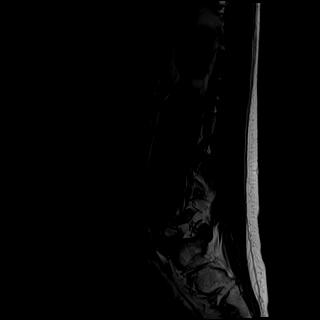

[Series 5: tirm sag · sagittal · 4.0mm · 0.55mm/px · 5 of 12 slices shown]
[im 1/12]
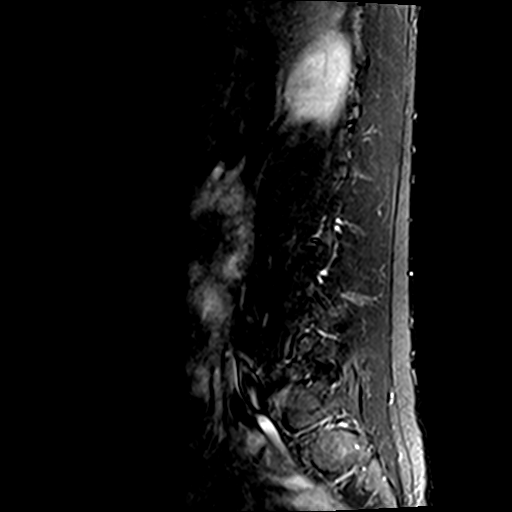
[im 3/12]
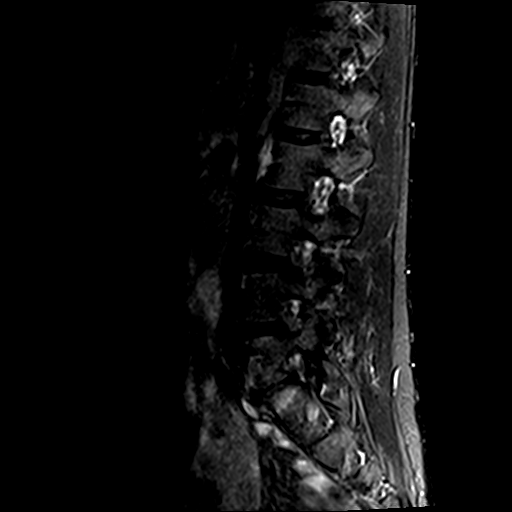
[im 6/12]
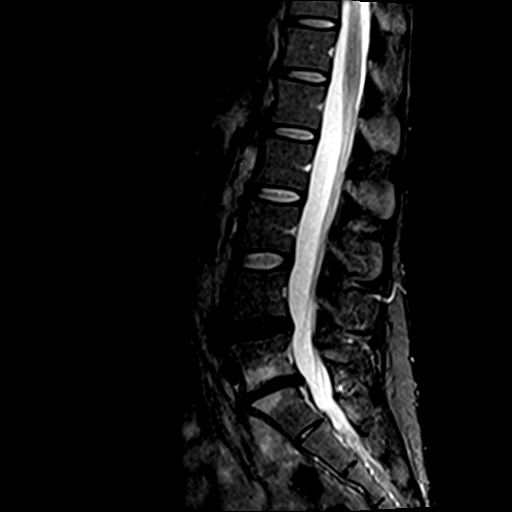
[im 9/12]
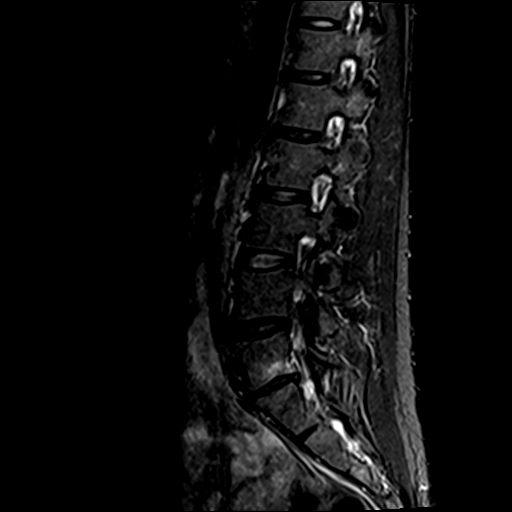
[im 12/12]
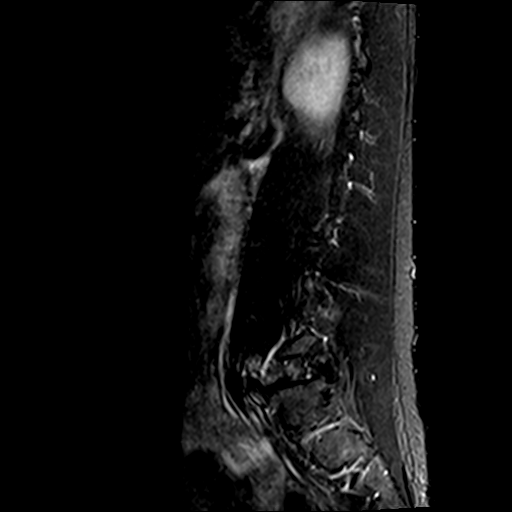

[Series 6: T1 · axial · 4.0mm · 0.78mm/px · z∈[-60,+158]mm · 16 of 35 slices shown (2 of 2)]
[im 1/35]
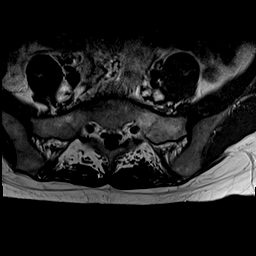
[im 3/35]
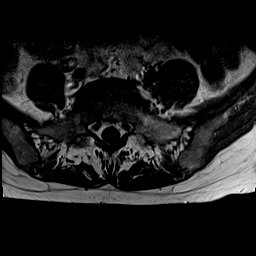
[im 5/35]
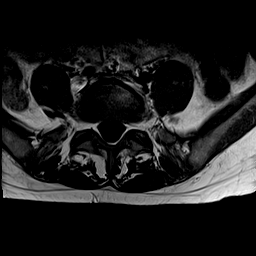
[im 7/35]
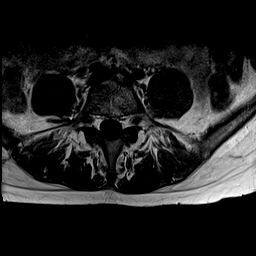
[im 10/35]
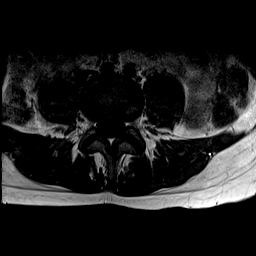
[im 12/35]
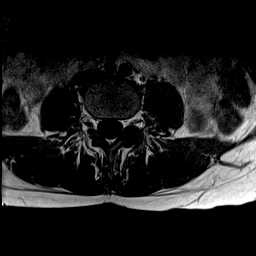
[im 14/35]
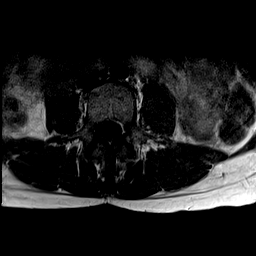
[im 16/35]
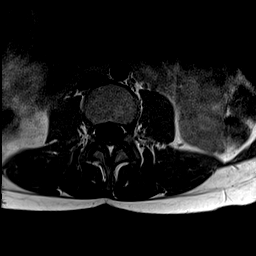
[im 19/35]
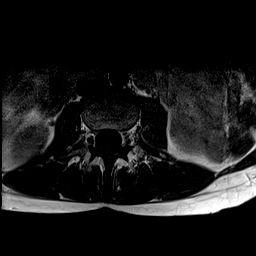
[im 21/35]
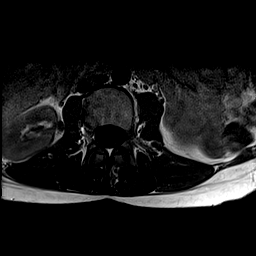
[im 23/35]
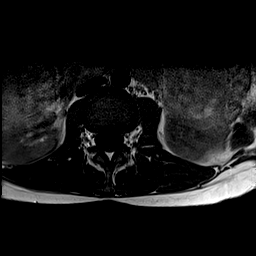
[im 25/35]
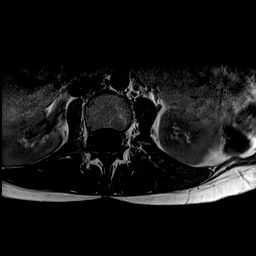
[im 28/35]
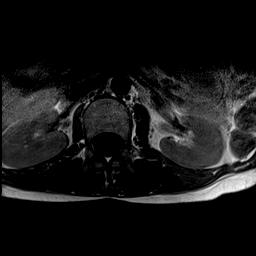
[im 30/35]
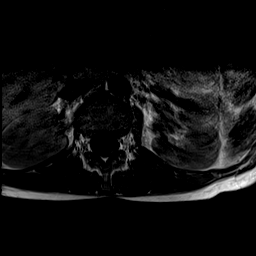
[im 32/35]
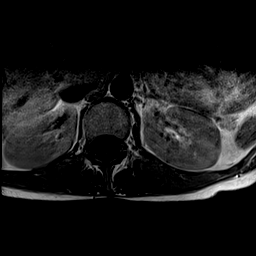
[im 35/35]
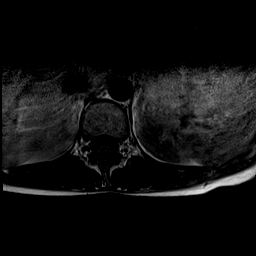

[Series 7: T2 · axial · 4.0mm · 0.78mm/px · z∈[-60,+158]mm · 16 of 35 slices shown]
[im 1/35]
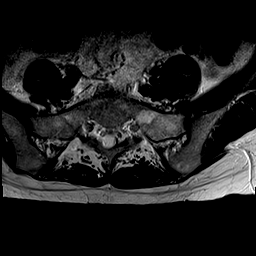
[im 3/35]
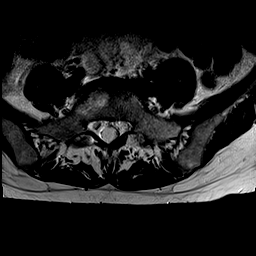
[im 5/35]
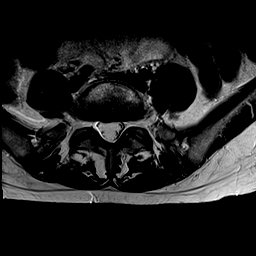
[im 7/35]
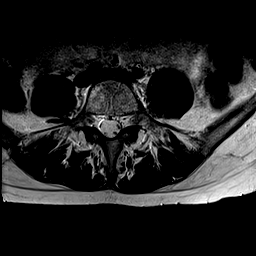
[im 10/35]
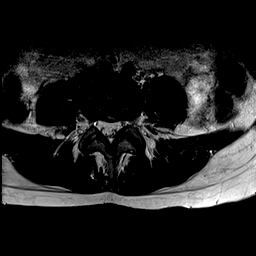
[im 12/35]
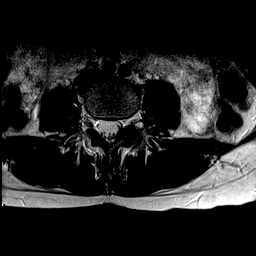
[im 14/35]
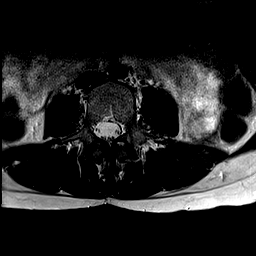
[im 16/35]
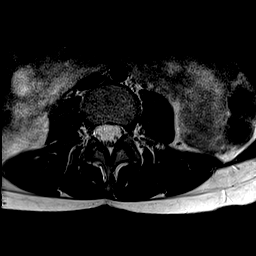
[im 19/35]
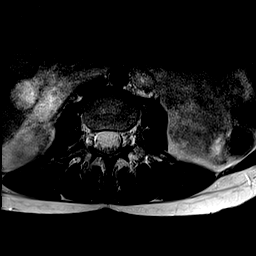
[im 21/35]
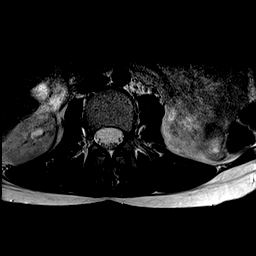
[im 23/35]
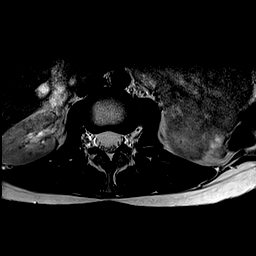
[im 25/35]
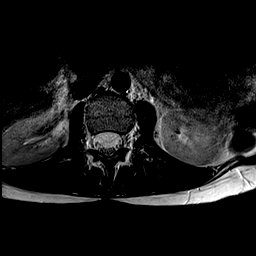
[im 28/35]
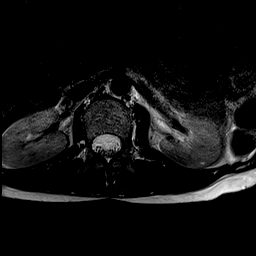
[im 30/35]
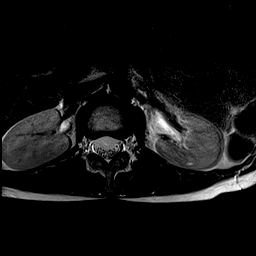
[im 32/35]
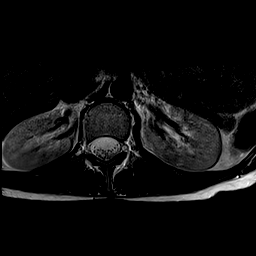
[im 35/35]
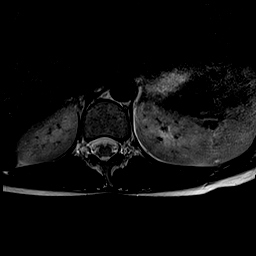

[48 of 48 positions shown; findings below may reference images not displayed]

FINDINGS: Segmentation:  Normal

Alignment:  Normal

Vertebrae:  Negative for fracture or mass.

Conus medullaris and cauda equina: Conus extends to the L1-2 level.
Conus and cauda equina appear normal.

Paraspinal and other soft tissues: Negative for paraspinous mass or
adenopathy.

Image quality degraded by mild motion.

Disc levels:

L1-2: Negative

L2-3: Negative

L3-4: Negative

L4-5: Small central disc protrusion and mild facet degeneration. No
significant spinal or foraminal stenosis

L5-S1: Moderate disc degeneration. Disc space narrowing and endplate
spurring. Endplate edema related to disc degeneration. Mild spurring
without significant spinal or foraminal stenosis.
IMPRESSION: Small central disc protrusion L4-5

Disc degeneration and spurring L5-S1 without significant stenosis or
neural impingement.
